# Patient Record
Sex: Male | Born: 1970 | Race: White | Hispanic: No | Marital: Single | Smoking: Former smoker
Health system: Southern US, Community
[De-identification: ages and names within clinical notes are randomized; demographics above are authoritative.]

## PROBLEM LIST (undated history)

## (undated) DIAGNOSIS — G629 Polyneuropathy, unspecified: Secondary | ICD-10-CM

## (undated) DIAGNOSIS — E119 Type 2 diabetes mellitus without complications: Secondary | ICD-10-CM

## (undated) DIAGNOSIS — I10 Essential (primary) hypertension: Secondary | ICD-10-CM

## (undated) HISTORY — DX: Type 2 diabetes mellitus without complications: E11.9

## (undated) HISTORY — PX: STOMACH FOREIGN BODY REMOVAL: SHX2449

---

## 2014-02-13 ENCOUNTER — Ambulatory Visit (INDEPENDENT_AMBULATORY_CARE_PROVIDER_SITE_OTHER): Payer: 59 | Admitting: Family Medicine

## 2014-02-13 VITALS — BP 126/78 | HR 91 | Temp 98.3°F | Resp 17 | Ht 70.5 in | Wt 234.0 lb

## 2014-02-13 DIAGNOSIS — R209 Unspecified disturbances of skin sensation: Secondary | ICD-10-CM

## 2014-02-13 DIAGNOSIS — E669 Obesity, unspecified: Secondary | ICD-10-CM

## 2014-02-13 DIAGNOSIS — Z125 Encounter for screening for malignant neoplasm of prostate: Secondary | ICD-10-CM

## 2014-02-13 DIAGNOSIS — R200 Anesthesia of skin: Secondary | ICD-10-CM

## 2014-02-13 DIAGNOSIS — R202 Paresthesia of skin: Secondary | ICD-10-CM

## 2014-02-13 LAB — LIPID PANEL
CHOLESTEROL: 477 mg/dL — AB (ref 0–200)
HDL: 26 mg/dL — ABNORMAL LOW (ref >39)
Total CHOL/HDL Ratio: 18.3 Ratio
Triglycerides: 3784 mg/dL — ABNORMAL HIGH (ref <150)

## 2014-02-13 LAB — COMPREHENSIVE METABOLIC PANEL
ALT: 33 U/L (ref 0–53)
AST: 62 U/L — ABNORMAL HIGH (ref 0–37)
Albumin: 4.9 g/dL (ref 3.5–5.2)
Alkaline Phosphatase: 79 U/L (ref 39–117)
BUN: 9 mg/dL (ref 6–23)
CO2: 27 mEq/L (ref 19–32)
Calcium: 9.6 mg/dL (ref 8.4–10.5)
Chloride: 91 mEq/L — ABNORMAL LOW (ref 96–112)
Creat: 1.44 mg/dL — ABNORMAL HIGH (ref 0.50–1.35)
Glucose, Bld: 249 mg/dL — ABNORMAL HIGH (ref 70–99)
Potassium: 4.4 mEq/L (ref 3.5–5.3)
Sodium: 127 mEq/L — ABNORMAL LOW (ref 135–145)
Total Bilirubin: 0.7 mg/dL (ref 0.2–1.2)
Total Protein: 7.8 g/dL (ref 6.0–8.3)

## 2014-02-13 NOTE — Patient Instructions (Signed)
Gradually decrease your Dr. Reino KentPepper consumption Think about stopping smoking- you can do it! See the tips below. Please make an appointment to establish regular care at the [facility] Center next door-  You will be notified of your lab results within 5-7 days. If you have not heard from us, please give us a call.  Smoking Cessation, Tips for Success If you are ready to quit smoking, congratulations! You have chosen to help yourself be healthier. Cigarettes bring nicotine, tar, carbon monoxide, and other irritants into your body. Your lungs, heart, and blood vessels will be able to work better without these poisons. There are many different ways to quit smoking. Nicotine gum, nicotine patches, a nicotine inhaler, or nicotine nasal spray can help with physical craving. Hypnosis, support groups, and medicines help break the habit of smoking. WHAT THINGS CAN I DO TO MAKE QUITTING EASIER?  Here are some tips to help you quit for good:  Pick a date when you will quit smoking completely. Tell all of your friends and family about your plan to quit on that date.  Do not try to slowly cut down on the number of cigarettes you are smoking. Pick a quit date and quit smoking completely starting on that day.  Throw away all cigarettes.   Clean and remove all ashtrays from your home, work, and car.   On a card, write down your reasons for quitting. Carry the card with you and read it when you get the urge to smoke.   Cleanse your body of nicotine. Drink enough water and fluids to keep your urine clear or pale yellow. Do this after quitting to flush the nicotine from your body.   Learn to predict your moods. Do not let a bad situation be your excuse to have a cigarette. Some situations in your life might tempt you into wanting a cigarette.   Never have "just one" cigarette. It leads to wanting another and another. Remind yourself of your decision to quit.   Change habits associated with smoking. If  you smoked while driving or when feeling stressed, try other activities to replace smoking. Stand up when drinking your coffee. Brush your teeth after eating. Sit in a different chair when you read the paper. Avoid alcohol while trying to quit, and try to drink fewer caffeinated beverages. Alcohol and caffeine may urge you to smoke.   Avoid foods and drinks that can trigger a desire to smoke, such as sugary or spicy foods and alcohol.   Ask people who smoke not to smoke around you.   Have something planned to do right after eating or having a cup of coffee. For example, plan to take a walk or exercise.   Try a relaxation exercise to calm you down and decrease your stress. Remember, you may be tense and nervous for the first 2 weeks after you quit, but this will pass.   Find new activities to keep your hands busy. Play with a pen, coin, or rubber band. Doodle or draw things on paper.   Brush your teeth right after eating. This will help cut down on the craving for the taste of tobacco after meals. You can also try mouthwash.   Use oral substitutes in place of cigarettes. Try using lemon drops, carrots, cinnamon sticks, or chewing gum. Keep them handy so they are available when you have the urge to smoke.   When you have the urge to smoke, try deep breathing.   Designate your home as a  nonsmoking area.   If you are a heavy smoker, ask your health care provider about a prescription for nicotine chewing gum. It can ease your withdrawal from nicotine.   Reward yourself. Set aside the cigarette money you save and buy yourself something nice.   Look for support from others. Join a support group or smoking cessation program. Ask someone at home or at work to help you with your plan to quit smoking.   Always ask yourself, "Do I need this cigarette or is this just a reflex?" Tell yourself, "Today, I choose not to smoke," or "I do not want to smoke." You are reminding yourself of your  decision to quit.  Do not replace cigarette smoking with electronic cigarettes (commonly called e-cigarettes). The safety of e-cigarettes is unknown, and some may contain harmful chemicals.  If you relapse, do not give up! Plan ahead and think about what you will do the next time you get the urge to smoke.  HOW WILL I FEEL WHEN I QUIT SMOKING? You may have symptoms of withdrawal because your body is used to nicotine (the addictive substance in cigarettes). You may crave cigarettes, be irritable, feel very hungry, cough often, get headaches, or have difficulty concentrating. The withdrawal symptoms are only temporary. They are strongest when you first quit but will go away within 10 14 days. When withdrawal symptoms occur, stay in control. Think about your reasons for quitting. Remind yourself that these are signs that your body is healing and getting used to being without cigarettes. Remember that withdrawal symptoms are easier to treat than the major diseases that smoking can cause.  Even after the withdrawal is over, expect periodic urges to smoke. However, these cravings are generally short lived and will go away whether you smoke or not. Do not smoke!  WHAT RESOURCES ARE AVAILABLE TO HELP ME QUIT SMOKING? Your health care provider can direct you to community resources or hospitals for support, which may include:  Group support.  Education.  Hypnosis.  Therapy. Document Released: 07/15/2004 Document Revised: 08/07/2013 Document Reviewed: 04/04/2013 [facility] Patient Information 2014 [place], [place].

## 2014-02-13 NOTE — Progress Notes (Signed)
   Subjective:    Patient ID: Calvin Wells, male    DOB: 26-Mar-1971, 43 y.o.   MRN: [id number]  HPI Woke up this morning and noticed that his right 4 th and 5 th fingers were numb. The 4 th finger is no longer numb, but the 5 th finger remains numb in the tip. He reports that this used to happen more often when he would sleep on his hand, but he avoided sleeping on his hand for the last 6 months with resolution of symptoms until today. Denies swelling or redness. No weakness.  Left hand injury, bumped it on a recliner, about one month ago. Feels like it is numb over dorsal area. Has been feeling better with time. No swelling or redness or difficulty with movement.  Also turned left ankle about a month ago and felt like he had a tear in it. It hurts intermittently and feels numb. This has gotten progressively better and does not effect his ambulation or ability to stand and work.  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   Patient smokes 1 ppd. Drinks 10-12 regular Dr. Reino KentPepper sodas a day. Reports he is really "thirsty for them." He is concerned that he may have diabetes; his older sister was recently diagnosed.   Review of Systems No chest pain, no shortness of breath, no cough, no nasal congestion.     Objective:   Physical Exam  Vitals reviewed. Constitutional: He is oriented to person, place, and time. He appears well-developed and well-nourished.  HENT:  Head: Normocephalic and atraumatic.  Right Ear: External ear normal.  Left Ear: External ear normal.  Mouth/Throat: Uvula is midline, oropharynx is clear and moist and mucous membranes are normal. Abnormal dentition.  Poor dentition.   Eyes: Conjunctivae are normal. Right eye exhibits no discharge. Left eye exhibits no discharge.  Neck: Normal range of motion. Neck supple.  Cardiovascular: Normal rate, regular rhythm, normal heart sounds and intact distal pulses.     Pulmonary/Chest: Effort normal and breath sounds normal.  Musculoskeletal: Normal range of motion. He exhibits no edema and no tenderness.  Bilateral hands with full ROM, good strength, brisk cap refill, strong radial pulses. No joint swelling or erythema.   Lymphadenopathy:    He has no cervical adenopathy.  Neurological: He is alert and oriented to person, place, and time. Coordination abnormal.  Skin: Skin is warm and dry.  Psychiatric: He has a normal mood and affect. His behavior is normal. Judgment and thought content normal.      Assessment & Plan:  1. Obesity, unspecified - Comprehensive metabolic panel - Lipid panel -Discussed concern about diabetes and encouraged patient to drastically and immediately reduce regular soda intake.  2. Numbness and tingling in right hand - CBC  3. Encounter for screening for malignant neoplasm of prostate - PSA  4- Right hand numbness -This appears to be intermittent and self limiting. Patient has been able to avoid this for 6 months by avoiding sleeping on his hand. Encouraged him to continue to avoid compressing hand/fingers during sleep.  5- Tobacco abuse -Provided written and verbal encouragement and stop smoking strategies. -[facility]our[facility] patient [facility]  Emi Belfasteborah B. Gessner, FNP-BC  [facility]  02/14/2014 1:13 PM

## 2014-02-14 ENCOUNTER — Other Ambulatory Visit: Payer: Self-pay | Admitting: Family Medicine

## 2014-02-14 DIAGNOSIS — E871 Hypo-osmolality and hyponatremia: Secondary | ICD-10-CM

## 2014-02-14 LAB — CBC
HCT: 47.9 % (ref 39.0–52.0)
HEMOGLOBIN: 16.5 g/dL (ref 13.0–17.0)
MCH: 32.4 pg (ref 26.0–34.0)
MCHC: 34.4 g/dL (ref 30.0–36.0)
MCV: 94.1 fL (ref 78.0–100.0)
PLATELETS: 155 10*3/uL (ref 150–400)
RBC: 5.09 MIL/uL (ref 4.22–5.81)
RDW: 13.3 % (ref 11.5–15.5)
WBC: 10.6 10*3/uL — ABNORMAL HIGH (ref 4.0–10.5)

## 2014-02-14 LAB — PSA: PSA: 0.5 ng/mL (ref <=4.00)

## 2014-02-15 ENCOUNTER — Other Ambulatory Visit (INDEPENDENT_AMBULATORY_CARE_PROVIDER_SITE_OTHER): Payer: 59 | Admitting: Physician Assistant

## 2014-02-15 ENCOUNTER — Other Ambulatory Visit: Payer: Self-pay | Admitting: Physician Assistant

## 2014-02-15 VITALS — BP 130/86 | HR 82 | Temp 98.1°F | Resp 17 | Ht 71.0 in | Wt 229.5 lb

## 2014-02-15 DIAGNOSIS — E781 Pure hyperglyceridemia: Secondary | ICD-10-CM

## 2014-02-15 DIAGNOSIS — E871 Hypo-osmolality and hyponatremia: Secondary | ICD-10-CM

## 2014-02-15 DIAGNOSIS — R7309 Other abnormal glucose: Secondary | ICD-10-CM

## 2014-02-15 LAB — POCT GLYCOSYLATED HEMOGLOBIN (HGB A1C): Hemoglobin A1C: 7.4

## 2014-02-15 MED ORDER — METFORMIN HCL 500 MG PO TABS: 500.0000 mg | ORAL_TABLET | Freq: Every day | ORAL | Status: DC

## 2014-02-15 MED ORDER — GEMFIBROZIL 600 MG PO TABS: 600.0000 mg | ORAL_TABLET | Freq: Two times a day (BID) | ORAL | Status: DC

## 2014-02-15 NOTE — Progress Notes (Signed)
   Subjective:    Patient ID: Calvin Wells, male    DOB: 1971-01-08, 43 y.o.   MRN: [id number]  HPI   Calvin Wells is a very pleasant 43 yr old here for follow up on labs.  He was initially seen here 02/13/14 at which time it was noted that sodium was low (127) , glucose was high (249), and triglycerides were severely elevated at >3700.  At that time, pt was asked to fluid restrict and return for a stat BMP today.  Upon further discussion and research, it was felt that pt likely had pseudohyponatremia due to his severely elevated triglycerides (using correction factor Plasma triglycerides (g/L) x 0.002 = mEq/L decrease in Na +, pt's true sodium is thought to be 134)  He has no symptoms of hyponatremia today.  At this point, normalizing pt's triglycerides and glucose are the priority.      Review of Systems  Constitutional: Negative.   Respiratory: Negative.   Cardiovascular: Negative.        Objective:   Physical Exam  Vitals reviewed. Constitutional: He is oriented to person, place, and time. He appears well-developed and well-nourished. No distress.  HENT:  Head: Normocephalic and atraumatic.  Eyes: Conjunctivae are normal. No scleral icterus.  Pulmonary/Chest: Effort normal.  Neurological: He is alert and oriented to person, place, and time.  Skin: Skin is warm and dry.  Psychiatric: He has a normal mood and affect. His behavior is normal.      Results for orders placed in visit on 02/15/14  POCT GLYCOSYLATED HEMOGLOBIN (HGB A1C)      Result Value Ref Range   Hemoglobin A1C 7.4         Assessment & Plan:  Low sodium levels   Elevated glucose - Plan: POCT glycosylated hemoglobin (Hb A1C)  Hypertriglyceridemia  Calvin Wells is a very pleasant 43 yr old male here for follow up on labs from 02/13/14.  Pt has severely elevated triglycerides which is thought to have caused a falsely low sodium reading on CMP.  Based on correction factors, it is estimated that his true sodium is 134.  He  has no signs or symptoms of hyponatremia and has been in his usual state of health.  At this point, correcting his triglycerides are the priority.  We discussed that he is at increased risk of developing pancreatitis.  Will start Lopid BID.  We discussed the importance of lifestyle modifications as well - increase vegetables, fruits, lean meats, whole grains; avoid fast food and processed food; avoid sugar sweetened beverages; limit or preferably avoid etoh.  Encouraged daily exercise as well.    Will start low dose metformin today.  Random glucose at last visit 249 and A1C is 7.4 today.    Discussed with pt how to take medications (Lopid prior to meals, metformin with food)  He is to recheck (either by appt or walk in) within the next 2-4 wks.  He voices understanding and agrees with this plan.  Pt to RTC sooner if concerns prior to that.    Loleta DickerE. Elizabeth Abisai Deer MHS, PA-C [facility] 4/18/201511:23 AM

## 2014-02-15 NOTE — Patient Instructions (Signed)
Begin taking the gemfibrozil twice daily (about 30 minutes before breakfast and 30 minutes before dinner)  Begin taking the metformin once daily (take WITH breakfast)  In addition to taking these medicines, make sure you are eating plenty of vegetables, fruits, lean meats and whole grains.  Limit (or completely avoid!) fast food and processed food.  Limit alcohol to no more than 1 drink per day (or preferable abstain from alcohol completely.)  Make sure you are getting some exercise every day even if it just a brisk walk - all of these will help improve your triglycerides in addition to the gemfibrozil  Please come back in about 2-4 weeks to recheck your labs    Hypertriglyceridemia  Diet for High blood levels of Triglycerides Most fats in food are triglycerides. Triglycerides in your blood are stored as fat in your body. High levels of triglycerides in your blood may put you at a greater risk for heart disease and stroke.  Normal triglyceride levels are less than 150 mg/dL. Borderline high levels are 150-199 mg/dl. High levels are 200 - 499 mg/dL, and very high triglyceride levels are greater than 500 mg/dL. The decision to treat high triglycerides is generally based on the level. For people with borderline or high triglyceride levels, treatment includes weight loss and exercise. Drugs are recommended for people with very high triglyceride levels. Many people who need treatment for high triglyceride levels have metabolic syndrome. This syndrome is a collection of disorders that often include: insulin resistance, high blood pressure, blood clotting problems, high cholesterol and triglycerides. TESTING PROCEDURE FOR TRIGLYCERIDES  You should not eat 4 hours before getting your triglycerides measured. The normal range of triglycerides is between 10 and 250 milligrams per deciliter (mg/dl). Some people may have extreme levels (1000 or above), but your triglyceride level may be too high if it is above  150 mg/dl, depending on what other risk factors you have for heart disease.  People with high blood triglycerides may also have high blood cholesterol levels. If you have high blood cholesterol as well as high blood triglycerides, your risk for heart disease is probably greater than if you only had high triglycerides. High blood cholesterol is one of the main risk factors for heart disease. CHANGING YOUR DIET  Your weight can affect your blood triglyceride level. If you are more than 20% above your ideal body weight, you may be able to lower your blood triglycerides by losing weight. Eating less and exercising regularly is the best way to combat this. Fat provides more calories than any other food. The best way to lose weight is to eat less fat. Only 30% of your total calories should come from fat. Less than 7% of your diet should come from saturated fat. A diet low in fat and saturated fat is the same as a diet to decrease blood cholesterol. By eating a diet lower in fat, you may lose weight, lower your blood cholesterol, and lower your blood triglyceride level.  Eating a diet low in fat, especially saturated fat, may also help you lower your blood triglyceride level. Ask your dietitian to help you figure how much fat you can eat based on the number of calories your caregiver has prescribed for you.  Exercise, in addition to helping with weight loss may also help lower triglyceride levels.   Alcohol can increase blood triglycerides. You may need to stop drinking alcoholic beverages.  Too much carbohydrate in your diet may also increase your blood triglycerides. Some complex  carbohydrates are necessary in your diet. These may include bread, rice, potatoes, other starchy vegetables and cereals.  Reduce "simple" carbohydrates. These may include pure sugars, candy, honey, and jelly without losing other nutrients. If you have the kind of high blood triglycerides that is affected by the amount of  carbohydrates in your diet, you will need to eat less sugar and less high-sugar foods. Your caregiver can help you with this.  Adding 2-4 grams of fish oil (EPA+ DHA) may also help lower triglycerides. Speak with your caregiver before adding any supplements to your regimen. Following the Diet  Maintain your ideal weight. Your caregivers can help you with a diet. Generally, eating less food and getting more exercise will help you lose weight. Joining a weight control group may also help. Ask your caregivers for a good weight control group in your area.  Eat low-fat foods instead of high-fat foods. This can help you lose weight too.  These foods are lower in fat. Eat MORE of these:   Dried beans, peas, and lentils.  Egg whites.  Low-fat cottage cheese.  Fish.  Lean cuts of meat, such as round, sirloin, rump, and flank (cut extra fat off meat you fix).  Whole grain breads, cereals and pasta.  Skim and nonfat dry milk.  Low-fat yogurt.  Poultry without the skin.  Cheese made with skim or part-skim milk, such as mozzarella, parmesan, farmers', ricotta, or pot cheese. These are higher fat foods. Eat LESS of these:   Whole milk and foods made from whole milk, such as American, blue, cheddar, monterey jack, and swiss cheese  High-fat meats, such as luncheon meats, sausages, knockwurst, bratwurst, hot dogs, ribs, corned beef, ground pork, and regular ground beef.  Fried foods. Limit saturated fats in your diet. Substituting unsaturated fat for saturated fat may decrease your blood triglyceride level. You will need to read package labels to know which products contain saturated fats.  These foods are high in saturated fat. Eat LESS of these:   Fried pork skins.  Whole milk.  Skin and fat from poultry.  Palm oil.  Butter.  Shortening.  Cream cheese.  Tomasa BlaseBacon.  Margarines and baked goods made from listed oils.  Vegetable shortenings.  Chitterlings.  Fat from  meats.  Coconut oil.  Palm kernel oil.  Lard.  Cream.  Sour cream.  Fatback.  Coffee whiteners and non-dairy creamers made with these oils.  Cheese made from whole milk. Use unsaturated fats (both polyunsaturated and monounsaturated) moderately. Remember, even though unsaturated fats are better than saturated fats; you still want a diet low in total fat.  These foods are high in unsaturated fat:   Canola oil.  Sunflower oil.  Mayonnaise.  Almonds.  Peanuts.  Pine nuts.  Margarines made with these oils.  Safflower oil.  Olive oil.  Avocados.  Cashews.  Peanut butter.  Sunflower seeds.  Soybean oil.  Peanut oil.  Olives.  Pecans.  Walnuts.  Pumpkin seeds. Avoid sugar and other high-sugar foods. This will decrease carbohydrates without decreasing other nutrients. Sugar in your food goes rapidly to your blood. When there is excess sugar in your blood, your liver may use it to make more triglycerides. Sugar also contains calories without other important nutrients.  Eat LESS of these:   Sugar, brown sugar, powdered sugar, jam, jelly, preserves, honey, syrup, molasses, pies, candy, cakes, cookies, frosting, pastries, colas, soft drinks, punches, fruit drinks, and regular gelatin.  Avoid alcohol. Alcohol, even more than sugar, may increase blood  triglycerides. In addition, alcohol is high in calories and low in nutrients. Ask for sparkling water, or a diet soft drink instead of an alcoholic beverage. Suggestions for planning and preparing meals   Bake, broil, grill or roast meats instead of frying.  Remove fat from meats and skin from poultry before cooking.  Add spices, herbs, lemon juice or vinegar to vegetables instead of salt, rich sauces or gravies.  Use a non-stick skillet without fat or use no-stick sprays.  Cool and refrigerate stews and broth. Then remove the hardened fat floating on the surface before serving.  Refrigerate meat drippings  and skim off fat to make low-fat gravies.  Serve more fish.  Use less butter, margarine and other high-fat spreads on bread or vegetables.  Use skim or reconstituted non-fat dry milk for cooking.  Cook with low-fat cheeses.  Substitute low-fat yogurt or cottage cheese for all or part of the sour cream in recipes for sauces, dips or congealed salads.  Use half yogurt/half mayonnaise in salad recipes.  Substitute evaporated skim milk for cream. Evaporated skim milk or reconstituted non-fat dry milk can be whipped and substituted for whipped cream in certain recipes.  Choose fresh fruits for dessert instead of high-fat foods such as pies or cakes. Fruits are naturally low in fat. When Dining Out   Order low-fat appetizers such as fruit or vegetable juice, pasta with vegetables or tomato sauce.  Select clear, rather than cream soups.  Ask that dressings and gravies be served on the side. Then use less of them.  Order foods that are baked, broiled, poached, steamed, stir-fried, or roasted.  Ask for margarine instead of butter, and use only a small amount.  Drink sparkling water, unsweetened tea or coffee, or diet soft drinks instead of alcohol or other sweet beverages. QUESTIONS AND ANSWERS ABOUT OTHER FATS IN THE BLOOD: SATURATED FAT, TRANS FAT, AND CHOLESTEROL What is trans fat? Trans fat is a type of fat that is formed when vegetable oil is hardened through a process called hydrogenation. This process helps makes foods more solid, gives them shape, and prolongs their shelf life. Trans fats are also called hydrogenated or partially hydrogenated oils.  What do saturated fat, trans fat, and cholesterol in foods have to do with heart disease? Saturated fat, trans fat, and cholesterol in the diet all raise the level of LDL "bad" cholesterol in the blood. The higher the LDL cholesterol, the greater the risk for coronary heart disease (CHD). Saturated fat and trans fat raise LDL similarly.   What foods contain saturated fat, trans fat, and cholesterol? High amounts of saturated fat are found in animal products, such as fatty cuts of meat, chicken skin, and full-fat dairy products like butter, whole milk, cream, and cheese, and in tropical vegetable oils such as palm, palm kernel, and coconut oil. Trans fat is found in some of the same foods as saturated fat, such as vegetable shortening, some margarines (especially hard or stick margarine), crackers, cookies, baked goods, fried foods, salad dressings, and other processed foods made with partially hydrogenated vegetable oils. Small amounts of trans fat also occur naturally in some animal products, such as milk products, beef, and lamb. Foods high in cholesterol include liver, other organ meats, egg yolks, shrimp, and full-fat dairy products. How can I use the new food label to make heart-healthy food choices? Check the Nutrition Facts panel of the food label. Choose foods lower in saturated fat, trans fat, and cholesterol. For saturated fat and cholesterol,  you can also use the Percent Daily Value (%DV): 5% DV or less is low, and 20% DV or more is high. (There is no %DV for trans fat.) Use the Nutrition Facts panel to choose foods low in saturated fat and cholesterol, and if the trans fat is not listed, read the ingredients and limit products that list shortening or hydrogenated or partially hydrogenated vegetable oil, which tend to be high in trans fat. POINTS TO REMEMBER:   Discuss your risk for heart disease with your caregivers, and take steps to reduce risk factors.  Change your diet. Choose foods that are low in saturated fat, trans fat, and cholesterol.  Add exercise to your daily routine if it is not already being done. Participate in physical activity of moderate intensity, like brisk walking, for at least 30 minutes on most, and preferably all days of the week. No time? Break the 30 minutes into three, 10-minute segments during the  day.  Stop smoking. If you do smoke, contact your caregiver to discuss ways in which they can help you quit.  Do not use street drugs.  Maintain a normal weight.  Maintain a healthy blood pressure.  Keep up with your blood work for checking the fats in your blood as directed by your caregiver. Document Released: 08/04/2004 Document Revised: 04/17/2012 Document Reviewed: 03/02/2009 [facility] Patient Information 2014 [place], [place].

## 2014-02-15 NOTE — Progress Notes (Signed)
See lab only encounter dated 02/15/14 - I was unable to order medications in that encounter and thus did an orders only to send meds

## 2014-02-17 NOTE — Progress Notes (Signed)
I have discussed this case with Ms. Gessner, NP and agree.  

## 2014-03-08 ENCOUNTER — Other Ambulatory Visit (INDEPENDENT_AMBULATORY_CARE_PROVIDER_SITE_OTHER): Payer: 59 | Admitting: *Deleted

## 2014-03-08 ENCOUNTER — Telehealth: Payer: Self-pay

## 2014-03-08 ENCOUNTER — Ambulatory Visit (INDEPENDENT_AMBULATORY_CARE_PROVIDER_SITE_OTHER): Payer: 59

## 2014-03-08 DIAGNOSIS — E1165 Type 2 diabetes mellitus with hyperglycemia: Secondary | ICD-10-CM

## 2014-03-08 DIAGNOSIS — E785 Hyperlipidemia, unspecified: Secondary | ICD-10-CM

## 2014-03-08 DIAGNOSIS — E871 Hypo-osmolality and hyponatremia: Secondary | ICD-10-CM

## 2014-03-08 DIAGNOSIS — R7309 Other abnormal glucose: Secondary | ICD-10-CM

## 2014-03-08 DIAGNOSIS — IMO0001 Reserved for inherently not codable concepts without codable children: Secondary | ICD-10-CM

## 2014-03-08 LAB — BASIC METABOLIC PANEL
BUN: 8 mg/dL (ref 6–23)
CO2: 27 mEq/L (ref 19–32)
Calcium: 9.7 mg/dL (ref 8.4–10.5)
Chloride: 102 mEq/L (ref 96–112)
Creat: 0.94 mg/dL (ref 0.50–1.35)
GLUCOSE: 138 mg/dL — AB (ref 70–99)
POTASSIUM: 4.5 meq/L (ref 3.5–5.3)
Sodium: 138 mEq/L (ref 135–145)

## 2014-03-25 ENCOUNTER — Ambulatory Visit (INDEPENDENT_AMBULATORY_CARE_PROVIDER_SITE_OTHER): Payer: 59 | Admitting: Family Medicine

## 2014-03-25 ENCOUNTER — Encounter: Payer: Self-pay | Admitting: Family Medicine

## 2014-03-25 VITALS — BP 130/80 | HR 72 | Resp 18 | Ht 70.5 in | Wt 221.0 lb

## 2014-03-25 DIAGNOSIS — E119 Type 2 diabetes mellitus without complications: Secondary | ICD-10-CM

## 2014-03-25 DIAGNOSIS — E785 Hyperlipidemia, unspecified: Secondary | ICD-10-CM | POA: Insufficient documentation

## 2014-03-25 LAB — COMPREHENSIVE METABOLIC PANEL
ALT: 72 U/L — AB (ref 0–53)
AST: 52 U/L — ABNORMAL HIGH (ref 0–37)
Albumin: 5 g/dL (ref 3.5–5.2)
Alkaline Phosphatase: 65 U/L (ref 39–117)
BUN: 8 mg/dL (ref 6–23)
CALCIUM: 10.1 mg/dL (ref 8.4–10.5)
CHLORIDE: 99 meq/L (ref 96–112)
CO2: 29 meq/L (ref 19–32)
CREATININE: 0.98 mg/dL (ref 0.50–1.35)
GLUCOSE: 136 mg/dL — AB (ref 70–99)
Potassium: 4.4 mEq/L (ref 3.5–5.3)
Sodium: 137 mEq/L (ref 135–145)
Total Bilirubin: 0.8 mg/dL (ref 0.2–1.2)
Total Protein: 7.7 g/dL (ref 6.0–8.3)

## 2014-03-25 LAB — LIPID PANEL
Cholesterol: 212 mg/dL — ABNORMAL HIGH (ref 0–200)
HDL: 26 mg/dL — ABNORMAL LOW (ref >39)
TRIGLYCERIDES: 474 mg/dL — AB (ref <150)
Total CHOL/HDL Ratio: 8.2 Ratio

## 2014-03-25 NOTE — Patient Instructions (Signed)
Keep up the good work with your diet. I will notify you of your lab results. Please resume your medications. Follow up in 3 months, sooner if you have problems or questions.  Diabetes and Exercise Exercising regularly is important. It is not just about losing weight. It has many health benefits, such as:  Improving your overall fitness, flexibility, and endurance.  Increasing your bone density.  Helping with weight control.  Decreasing your body fat.  Increasing your muscle strength.  Reducing stress and tension.  Improving your overall health. People with diabetes who exercise gain additional benefits because exercise:  Reduces appetite.  Improves the body's use of blood sugar (glucose).  Helps lower or control blood glucose.  Decreases blood pressure.  Helps control blood lipids (such as cholesterol and triglycerides).  Improves the body's use of the hormone insulin by:  Increasing the body's insulin sensitivity.  Reducing the body's insulin needs.  Decreases the risk for heart disease because exercising:  Lowers cholesterol and triglycerides levels.  Increases the levels of good cholesterol (such as high-density lipoproteins [HDL]) in the body.  Lowers blood glucose levels. YOUR ACTIVITY PLAN  Choose an activity that you enjoy and set realistic goals. Your health care provider or diabetes educator can help you make an activity plan that works for you. You can break activities into 2 or 3 sessions throughout the day. Doing so is as good as one long session. Exercise ideas include:  Taking the dog for a walk.  Taking the stairs instead of the elevator.  Dancing to your favorite song.  Doing your favorite exercise with a friend. RECOMMENDATIONS FOR EXERCISING WITH TYPE 1 OR TYPE 2 DIABETES   Check your blood glucose before exercising. If blood glucose levels are greater than 240 mg/dL, check for urine ketones. Do not exercise if ketones are present.  Avoid  injecting insulin into areas of the body that are going to be exercised. For example, avoid injecting insulin into:  The arms when playing tennis.  The legs when jogging.  Keep a record of:  Food intake before and after you exercise.  Expected peak times of insulin action.  Blood glucose levels before and after you exercise.  The type and amount of exercise you have done.  Review your records with your health care provider. Your health care provider will help you to develop guidelines for adjusting food intake and insulin amounts before and after exercising.  If you take insulin or oral hypoglycemic agents, watch for signs and symptoms of hypoglycemia. They include:  Dizziness.  Shaking.  Sweating.  Chills.  Confusion.  Drink plenty of water while you exercise to prevent dehydration or heat stroke. Body water is lost during exercise and must be replaced.  Talk to your health care provider before starting an exercise program to make sure it is safe for you. Remember, almost any type of activity is better than none. Document Released: 01/07/2004 Document Revised: 06/19/2013 Document Reviewed: 03/26/2013 [facility] Patient Information 2014 [place], [place].

## 2014-03-25 NOTE — Progress Notes (Signed)
   Subjective:    Patient ID: Calvin Wells, male    DOB: 1971-03-22, 43 y.o.   MRN: [id number]  HPI Patient presents today for follow up of DM and hypertriglyceridemia. Has been watching diet and has lost 13 pounds since 02/13/14. Has significantly decreased regular soda intake and is eating more lean proteins, more fruits and vegetables. Is avoiding processed food and fast foods. He reports that his energy level is greatly improved and he "feels great."  Patient was prescribed gemfibrozil and metformin about 5 weeks ago. He took both prescriptions for 1 month and did not realize he was supposed to refill them and continue taking until follow up visit. He denies side effects while taking them.  He has not had any further hand pain/tingling.  Patient aware of need for smoking cessation, wanted to work on diet and weight loss and will work on smoking cessation next.   Review of Systems No visual changes, no nausea/vomiting, no chest pain, no SOB    Objective:   Physical Exam  Vitals reviewed. Constitutional: He is oriented to person, place, and time. He appears well-developed and well-nourished. No distress.  HENT:  Head: Normocephalic and atraumatic.  Right Ear: Tympanic membrane, external ear and ear canal normal.  Left Ear: Tympanic membrane, external ear and ear canal normal.  Nose: Nose normal.  Mouth/Throat: Uvula is midline, oropharynx is clear and moist and mucous membranes are normal.  Eyes: Conjunctivae are normal. Pupils are equal, round, and reactive to light. Right eye exhibits no discharge. Left eye exhibits no discharge. No scleral icterus.  Neck: Normal range of motion. Neck supple.  Cardiovascular: Normal rate, regular rhythm, normal heart sounds and intact distal pulses.   Pulmonary/Chest: Effort normal and breath sounds normal.  Musculoskeletal: Normal range of motion.  Lymphadenopathy:    He has no cervical adenopathy.  Neurological: He is alert and oriented to person,  place, and time.  Skin: Skin is warm and dry. He is not diaphoretic.  Psychiatric: He has a normal mood and affect. His behavior is normal. Judgment and thought content normal.       Assessment & Plan:  1. Other and unspecified hyperlipidemia - Comprehensive metabolic panel - Lipid panel -Continue decreasing sugar consumption and avoiding simple carbohydrates.  2. Diabetes -Discussed diagnosis and continued weight loss, increased exercise and continued metformin. - Comprehensive metabolic panel -Patient will schedule eye exam - Will follow up in 3 months with HgA1C   Emi Belfast, FNP-BC  [facility], [facility]  03/25/2014 11:02 AM   Emi Belfast, FNP-BC  [facility], [facility]  03/25/2014 11:01 AM

## 2014-03-26 DIAGNOSIS — E119 Type 2 diabetes mellitus without complications: Secondary | ICD-10-CM | POA: Insufficient documentation

## 2014-04-08 ENCOUNTER — Telehealth: Payer: Self-pay

## 2014-05-13 ENCOUNTER — Encounter: Payer: Self-pay | Admitting: Family Medicine

## 2014-05-13 ENCOUNTER — Ambulatory Visit (INDEPENDENT_AMBULATORY_CARE_PROVIDER_SITE_OTHER): Payer: 59 | Admitting: Family Medicine

## 2014-05-13 VITALS — BP 113/70 | HR 70 | Temp 98.6°F | Resp 16 | Ht 71.0 in | Wt 217.0 lb

## 2014-05-13 DIAGNOSIS — E781 Pure hyperglyceridemia: Secondary | ICD-10-CM

## 2014-05-13 DIAGNOSIS — R739 Hyperglycemia, unspecified: Secondary | ICD-10-CM

## 2014-05-13 DIAGNOSIS — E785 Hyperlipidemia, unspecified: Secondary | ICD-10-CM

## 2014-05-13 DIAGNOSIS — R7309 Other abnormal glucose: Secondary | ICD-10-CM

## 2014-05-13 DIAGNOSIS — F17200 Nicotine dependence, unspecified, uncomplicated: Secondary | ICD-10-CM

## 2014-05-13 DIAGNOSIS — Z720 Tobacco use: Secondary | ICD-10-CM

## 2014-05-13 DIAGNOSIS — E119 Type 2 diabetes mellitus without complications: Secondary | ICD-10-CM

## 2014-05-13 LAB — HEMOGLOBIN A1C
Hgb A1c MFr Bld: 5.9 % — ABNORMAL HIGH (ref <5.7)
MEAN PLASMA GLUCOSE: 123 mg/dL — AB (ref <117)

## 2014-05-13 MED ORDER — GEMFIBROZIL 600 MG PO TABS: 600.0000 mg | ORAL_TABLET | Freq: Two times a day (BID) | ORAL | Status: DC

## 2014-05-13 MED ORDER — METFORMIN HCL 500 MG PO TABS: 500.0000 mg | ORAL_TABLET | Freq: Every day | ORAL | Status: DC

## 2014-05-13 NOTE — Patient Instructions (Signed)
Keep up the good work with good food choices! Aim to lose about 1 pound a week- keep decreasing soda intake and increasing water intake

## 2014-05-13 NOTE — Progress Notes (Signed)
   Subjective:    Patient ID: Calvin Wells, male    DOB: Mar 25, 1971, 43 y.o.   MRN: [id number]  HPI Patient presents today follow up of DM/hypertrygleceridemia. Has had more energy with weight loss and watching what he eats. He feels like he is doing well physically, but is struggling emotionally. His mother passed away recently. She was 80 and had diabetes. He is sleeping well. He is able to enjoy activities and has been playing a lot of golf. His sister is helping him read food labels and make better food choices. He is drinking 1-2 cans Dr. Reino KentPepper a day, down from 6+. He is eating more lean meat/fish and snacking on fruit.   He is smoking less, 1 ppd, down from 1.5 ppd. He is contemplating quitting.  Last tetanus shot 5-6 years ago with work injury requiring stitches. Has regular dental care and eye exams.  Review of Systems No chest pain, no SOB,no wheeze, no headache, no medication side effects.     Objective:   Physical Exam  Vitals reviewed. Constitutional: He is oriented to person, place, and time. He appears well-developed and well-nourished.  HENT:  Head: Normocephalic and atraumatic.  Eyes: Conjunctivae are normal. Right eye exhibits no discharge. Left eye exhibits no discharge.  Neck: Normal range of motion. Neck supple.  Cardiovascular: Normal rate, regular rhythm and normal heart sounds.   Pulmonary/Chest: Effort normal and breath sounds normal.  Musculoskeletal: Normal range of motion.  Neurological: He is alert and oriented to person, place, and time.  Skin: Skin is warm and dry.  Psychiatric: He has a normal mood and affect. His behavior is normal. Judgment and thought content normal.   Wt Readings from Last 3 Encounters:  05/13/14 217 lb (98.431 kg)  03/25/14 221 lb (100.245 kg)  02/15/14 229 lb 8 oz (104.101 kg)  02/13/14 234 lb    Assessment & Plan:  1. Type 2 diabetes mellitus without complication - metFORMIN (GLUCOPHAGE) 500 MG tablet; Take 1 tablet (500 mg  total) by mouth daily with breakfast.  Dispense: 90 tablet; Refill: 1 - Hemoglobin A1c - encouraged continued weight loss, with goal 1 pound per week. 2. Other and unspecified hyperlipidemia  3. Elevated random blood glucose level - metFORMIN (GLUCOPHAGE) 500 MG tablet; Take 1 tablet (500 mg total) by mouth daily with breakfast.  Dispense: 90 tablet; Refill: 1 - Hemoglobin A1c  4. Hypertriglyceridemia - gemfibrozil (LOPID) 600 MG tablet; Take 1 tablet (600 mg total) by mouth 2 (two) times daily before a meal.  Dispense: 180 tablet; Refill: 1  5. Tobacco abuse - discussed smoking cessation, encouraged patient to continue contemplation and pick stop date  Follow up in 3-4 months   Emi Belfasteborah B. Taivon Haroon, FNP-BC  [facility], [facility]  05/13/2014 10:46 AM

## 2014-05-19 ENCOUNTER — Other Ambulatory Visit: Payer: Self-pay | Admitting: Physician Assistant

## 2014-06-03 ENCOUNTER — Ambulatory Visit: Payer: 59 | Admitting: Family Medicine

## 2014-08-12 ENCOUNTER — Ambulatory Visit: Payer: 59 | Admitting: Family Medicine

## 2014-08-18 ENCOUNTER — Ambulatory Visit: Payer: 59 | Admitting: Family Medicine

## 2014-11-15 ENCOUNTER — Other Ambulatory Visit: Payer: Self-pay | Admitting: Family Medicine

## 2014-12-13 ENCOUNTER — Other Ambulatory Visit: Payer: Self-pay | Admitting: Family Medicine

## 2014-12-31 ENCOUNTER — Other Ambulatory Visit: Payer: Self-pay | Admitting: Physician Assistant

## 2015-01-01 NOTE — Telephone Encounter (Signed)
Called pt to find out f/up plan bc we have put notices on last 2 RFs that he needs f/up. Pt advised that he did see them messages, but he has lost his job and insurance. He is hoping for a call back on a new job next week but as of now, it is difficult for him to come in. Eunice BlaseDebbie, you had asked pt to f/up in 3 mos when seen in July. Do you want to give him any more RFs, or do you need to see pt back before more RFs? If he needs OV now, do you have estimate of LOS and or any labs or other fees so that we can get pt an idea of expense?

## 2015-01-02 NOTE — Telephone Encounter (Signed)
-----   Message from Emi Belfasteborah B Gessner, FNP sent at 01/02/2015  8:23 AM EST ----- Leodis LiverpoolHey Mckinnley Cottier, I have approved this patient's 15 day supply of meds. I think it would be best for him to be seen. Approximate charges - 99213 OV, fasting lipid panel, HgBA1C and CMET. He can likely negotiate a discount with [facility] for his lab charges.  Thanks, Ameren CorporationDebbie

## 2015-01-02 NOTE — Telephone Encounter (Signed)
I spoke to pt and gave him the approx cost after talking with Billing for charges. Also advised there would be an add'l lab charge from [facility] but that they may be willing to negotiate a discount also. Transferred pt to Scheduling for appt. Newt Minionebbie, FYI

## 2015-01-26 ENCOUNTER — Encounter: Payer: Self-pay | Admitting: Family Medicine

## 2015-01-26 ENCOUNTER — Ambulatory Visit (INDEPENDENT_AMBULATORY_CARE_PROVIDER_SITE_OTHER): Payer: Self-pay | Admitting: Family Medicine

## 2015-01-26 VITALS — BP 127/84 | HR 108 | Temp 98.4°F | Resp 16 | Ht 72.0 in | Wt 227.0 lb

## 2015-01-26 DIAGNOSIS — E781 Pure hyperglyceridemia: Secondary | ICD-10-CM

## 2015-01-26 DIAGNOSIS — R7989 Other specified abnormal findings of blood chemistry: Secondary | ICD-10-CM

## 2015-01-26 DIAGNOSIS — E1165 Type 2 diabetes mellitus with hyperglycemia: Secondary | ICD-10-CM

## 2015-01-26 DIAGNOSIS — R945 Abnormal results of liver function studies: Principal | ICD-10-CM

## 2015-01-26 LAB — COMPREHENSIVE METABOLIC PANEL
ALK PHOS: 67 U/L (ref 39–117)
ALT: 26 U/L (ref 0–53)
AST: 24 U/L (ref 0–37)
Albumin: 4.9 g/dL (ref 3.5–5.2)
BILIRUBIN TOTAL: 0.6 mg/dL (ref 0.2–1.2)
BUN: 13 mg/dL (ref 6–23)
CO2: 24 mEq/L (ref 19–32)
Calcium: 10.2 mg/dL (ref 8.4–10.5)
Chloride: 98 mEq/L (ref 96–112)
Creat: 0.9 mg/dL (ref 0.50–1.35)
Glucose, Bld: 211 mg/dL — ABNORMAL HIGH (ref 70–99)
Potassium: 4.2 mEq/L (ref 3.5–5.3)
SODIUM: 133 meq/L — AB (ref 135–145)
TOTAL PROTEIN: 8.1 g/dL (ref 6.0–8.3)

## 2015-01-26 LAB — LIPID PANEL
CHOLESTEROL: 339 mg/dL — AB (ref 0–200)
HDL: 29 mg/dL — ABNORMAL LOW (ref >=40)
Total CHOL/HDL Ratio: 11.7 Ratio
Triglycerides: 1303 mg/dL — ABNORMAL HIGH (ref <150)

## 2015-01-26 LAB — POCT GLYCOSYLATED HEMOGLOBIN (HGB A1C): Hemoglobin A1C: 6.5

## 2015-01-26 NOTE — Patient Instructions (Signed)
Blood sugar is borderline diabetic- keep working on healthy eating, decrease your soda intake and increase physical activity. Try to get some type of exercise every other day (gardening and housework do count) We will call you about your other blood tests and whether or not you need to adjust your medication.

## 2015-01-26 NOTE — Progress Notes (Signed)
   Subjective:    Patient ID: Calvin Wells, male    DOB: September 20, 1971, 44 y.o.   MRN: [id number]  HPI Patient presents today in follow up of DM 2 and hypertriglyceridemia. He lost his job of 15 years awhile back and no longer has insurance. He was off his metformin and gemfibrozil for a couple of weeks, but is back on them. He continues to watch his diet, but has gained back some of the weight he lost after his initial diagnosis.  He continues to smoke 1 ppd. He doesn't smoke as much when he is busy. He is using his time of unemployment to do some projects and yard work. He denies anxiety or depression and reports that he sleeps well most nights.   Review of Systems No nausea, no vomiting, no chest pain or SOB. Some loose stools.     Objective:   Physical Exam  Constitutional: He is oriented to person, place, and time. He appears well-developed and well-nourished.  HENT:  Head: Normocephalic and atraumatic.  Eyes: Conjunctivae are normal. Pupils are equal, round, and reactive to light.  Neck: Normal range of motion. Neck supple.  Cardiovascular: Normal rate, regular rhythm and normal heart sounds.   Heart rate 88 on auscultation.   Pulmonary/Chest: Effort normal and breath sounds normal.  Musculoskeletal: Normal range of motion.  Neurological: He is alert and oriented to person, place, and time.  Skin: Skin is warm and dry.  Psychiatric: He has a normal mood and affect. His behavior is normal. Judgment and thought content normal.  Vitals reviewed.   BP 127/84 mmHg  Pulse 108  Temp(Src) 98.4 F (36.9 C) (Oral)  Resp 16  Ht 6' (1.829 m)  Wt 227 lb (102.967 kg)  BMI 30.78 kg/m2  SpO2 96% HgA1C 6.5 (was 5.9 7/15)    Assessment & Plan:  1. Elevated LFTs - Comprehensive metabolic panel  2. Hypertriglyceridemia - Lipid panel  3. Type 2 diabetes mellitus with hyperglycemia - POCT glycosylated hemoglobin (Hb A1C) - Discussed results of HbA1C with patient, continue metformin 500 mg  BID and work on diet/exercise/weight loss  - follow up in 6 months  Emi Belfasteborah B. Ambika Zettlemoyer, FNP-BC  [facility], [facility]  01/26/2015 1:52 PM

## 2015-05-11 ENCOUNTER — Ambulatory Visit: Payer: Self-pay | Admitting: Family Medicine

## 2015-05-27 ENCOUNTER — Ambulatory Visit (INDEPENDENT_AMBULATORY_CARE_PROVIDER_SITE_OTHER): Payer: Self-pay | Admitting: Emergency Medicine

## 2015-05-27 ENCOUNTER — Ambulatory Visit (INDEPENDENT_AMBULATORY_CARE_PROVIDER_SITE_OTHER): Payer: Self-pay

## 2015-05-27 VITALS — BP 128/76 | HR 83 | Temp 98.5°F | Resp 16 | Ht 72.0 in | Wt 233.6 lb

## 2015-05-27 DIAGNOSIS — Z23 Encounter for immunization: Secondary | ICD-10-CM

## 2015-05-27 DIAGNOSIS — S5001XA Contusion of right elbow, initial encounter: Secondary | ICD-10-CM

## 2015-05-27 DIAGNOSIS — S59901A Unspecified injury of right elbow, initial encounter: Secondary | ICD-10-CM

## 2015-05-27 DIAGNOSIS — S51011A Laceration without foreign body of right elbow, initial encounter: Secondary | ICD-10-CM

## 2015-05-27 DIAGNOSIS — S5000XA Contusion of unspecified elbow, initial encounter: Secondary | ICD-10-CM | POA: Insufficient documentation

## 2015-05-27 DIAGNOSIS — S51019A Laceration without foreign body of unspecified elbow, initial encounter: Secondary | ICD-10-CM | POA: Insufficient documentation

## 2015-05-27 NOTE — Progress Notes (Signed)
    MRN: [id number] DOB: February 16, 1971  Subjective:   Calvin Wells is a 44 y.o. male presenting for chief complaint of Elbow Injury  Reports that he was working with a piece of wood, it tore apart and suffered a laceration from a metal piece stuck on the piece of wood. Patient bled profusely, washed his wound out, applied a bandage and came to our clinic. Currently reports pain over wound. He denies loss of ROM, decreased sensation, weakness, numbness or tingling. Denies any other aggravating or relieving factors, no other questions or concerns.  Calvin Wells has a current medication list which includes the following prescription(s): gemfibrozil and metformin. He has No Known Allergies.  Calvin Wells  has a past medical history of Diabetes mellitus without complication. Also  has past surgical history that includes Stomach foreign body removal.  ROS As in subjective.  Objective:   Vitals: BP 128/76 mmHg  Pulse 83  Temp(Src) 98.5 F (36.9 C) (Oral)  Resp 16  Ht 6' (1.829 m)  Wt 233 lb 9.6 oz (105.96 kg)  BMI 31.67 kg/m2  SpO2 96%  Physical Exam  Constitutional: He is oriented to person, place, and time. He appears well-developed and well-nourished.  Cardiovascular: Normal rate.   Pulmonary/Chest: Effort normal.  Musculoskeletal:       Right forearm: He exhibits tenderness, swelling and laceration. He exhibits no bony tenderness, no edema and no deformity.       Arms: Neurological: He is alert and oriented to person, place, and time.  Skin: Skin is warm and dry. No rash noted. No erythema. No pallor.   [facility] reading (PRIMARY) by  Dr. Dareen Piano and PA-Arieal Cuoco. Right elbow - no metallic foreign body visualized. No fracture or dislocation.  PROCEDURE NOTE: laceration repair Verbal consent obtained from patient.  Local anesthesia with 5cc Lidocaine 2% with epinephrine.  Wound explored for tendon, ligament damage, no foreign body observed. Wound scrubbed with soap and water and rinsed. Wound closed with #3  4-0 Prolene horizontal mattress sutures.  Wound cleansed and dressed.  Assessment and Plan :   1. Elbow laceration, right, initial encounter 2. Elbow injury, right, initial encounter 3. Elbow contusion, right, initial encounter - Stable, physical exam findings and x-ray reassuring - Counseled patient on signs of infection, rtc if these develop - RTC in 10-14 days for suture removal  4. Need for Tdap vaccination - Tdap vaccine greater than or equal to 7yo IM  Wallis Bamberg, PA-C [facility] [phone] 05/27/2015 3:38 PM

## 2015-05-27 NOTE — Progress Notes (Signed)
  Medical screening examination/treatment/procedure(s) were performed by non-physician practitioner and as supervising physician I was immediately available for consultation/collaboration.     

## 2015-05-27 NOTE — Patient Instructions (Signed)

## 2015-06-10 ENCOUNTER — Ambulatory Visit: Payer: Self-pay | Admitting: Family Medicine

## 2015-06-10 VITALS — BP 122/74 | HR 88 | Temp 98.7°F | Resp 18

## 2015-06-10 DIAGNOSIS — S51011D Laceration without foreign body of right elbow, subsequent encounter: Secondary | ICD-10-CM

## 2015-06-10 DIAGNOSIS — Z4802 Encounter for removal of sutures: Secondary | ICD-10-CM

## 2015-06-10 NOTE — Progress Notes (Signed)
   Subjective:    Patient ID: Calvin Wells, male    DOB: 08-Apr-1971, 44 y.o.   MRN: [id number]  HPI This is a pleasant 44 yo male who presents today for suture removal. He had 3 horizontal mattress sutures applied for right elbow laceration. He has not had any pain, swelling or drainage of wound. He has full ROM of elbow.   Review of Systems As per HPI    Objective:   Physical Exam  Constitutional: He is oriented to person, place, and time. He appears well-developed and well-nourished. No distress.  HENT:  Head: Normocephalic and atraumatic.  Eyes: Conjunctivae are normal.  Neck: Normal range of motion.  Cardiovascular: Normal rate.   Pulmonary/Chest: Effort normal.  Musculoskeletal: Normal range of motion.  Neurological: He is alert and oriented to person, place, and time.  Skin: Skin is warm and dry. He is not diaphoretic.  Psychiatric: He has a normal mood and affect. His behavior is normal. Judgment and thought content normal.  Vitals reviewed.  BP 122/74 mmHg  Pulse 88  Temp(Src) 98.7 F (37.1 C) (Oral)  Resp 18  SpO2 97% Wound clean and dry. Moderate amount scabbing. 3 horizontal mattress sutures remove. Wound edges well approximated.     Assessment & Plan:  1. Elbow laceration, right, subsequent encounter - good wound healing, sutures removed - follow up PRN   Olean Ree, FNP-BC  [facility], [facility]  06/10/2015 4:34 PM

## 2015-11-06 NOTE — Telephone Encounter (Signed)
Error

## 2016-01-24 IMAGING — CR DG ELBOW COMPLETE 3+V*R*
3 series · 3 of 3 positions shown · non-contrast
Comparison: None.

CLINICAL DATA: Initial encounter: Pain and laceration following
injury

EXAM:
RIGHT ELBOW - COMPLETE 3+ VIEW

[AP]
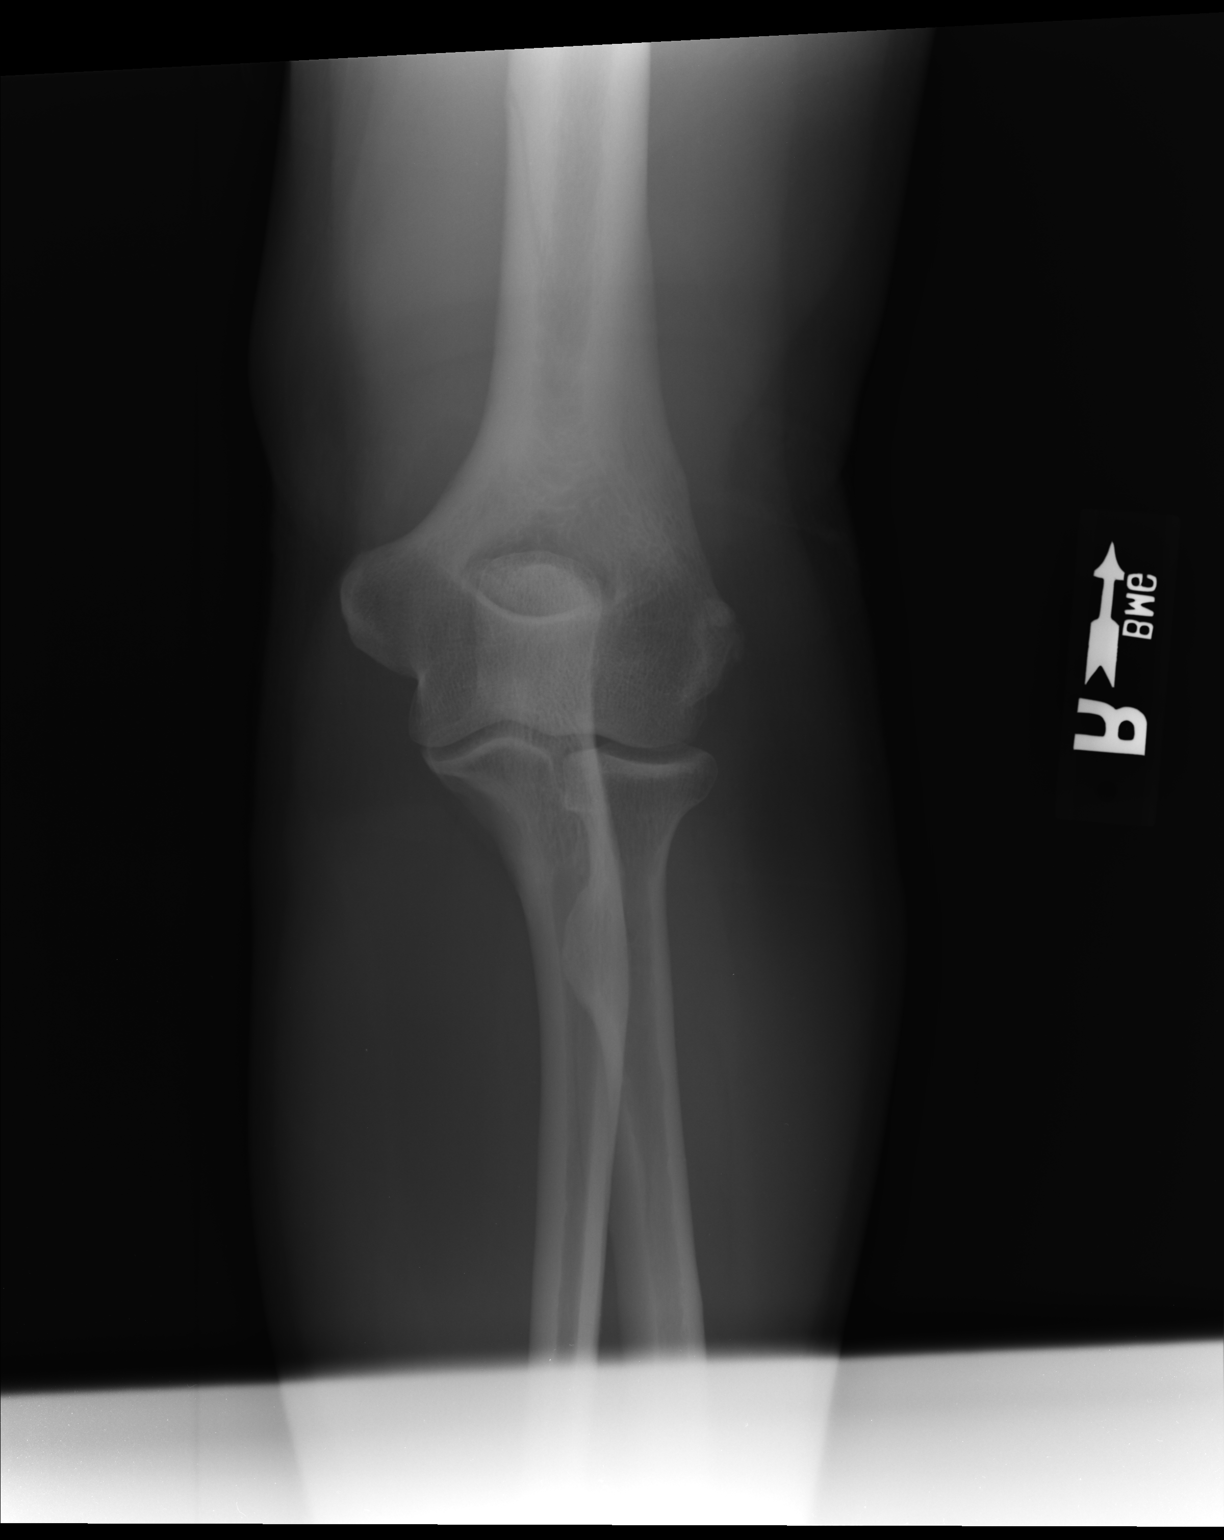

[ap obl ext rot]
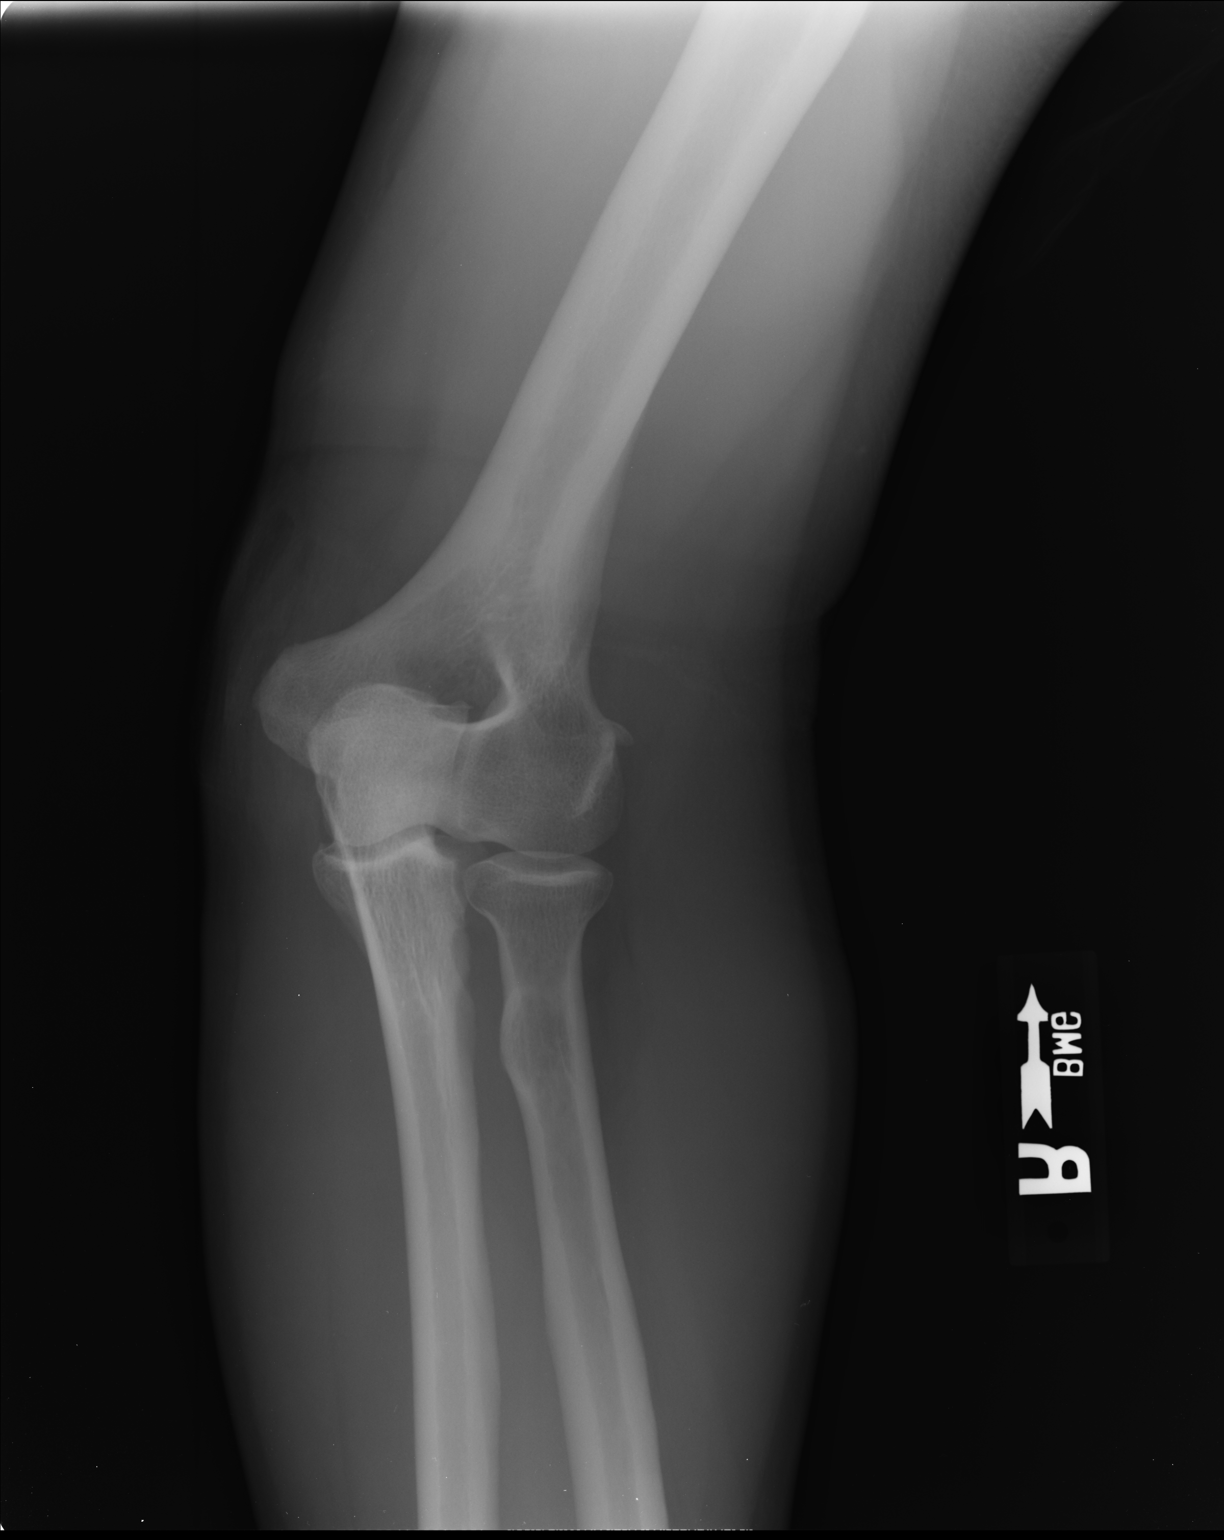

[lateral]
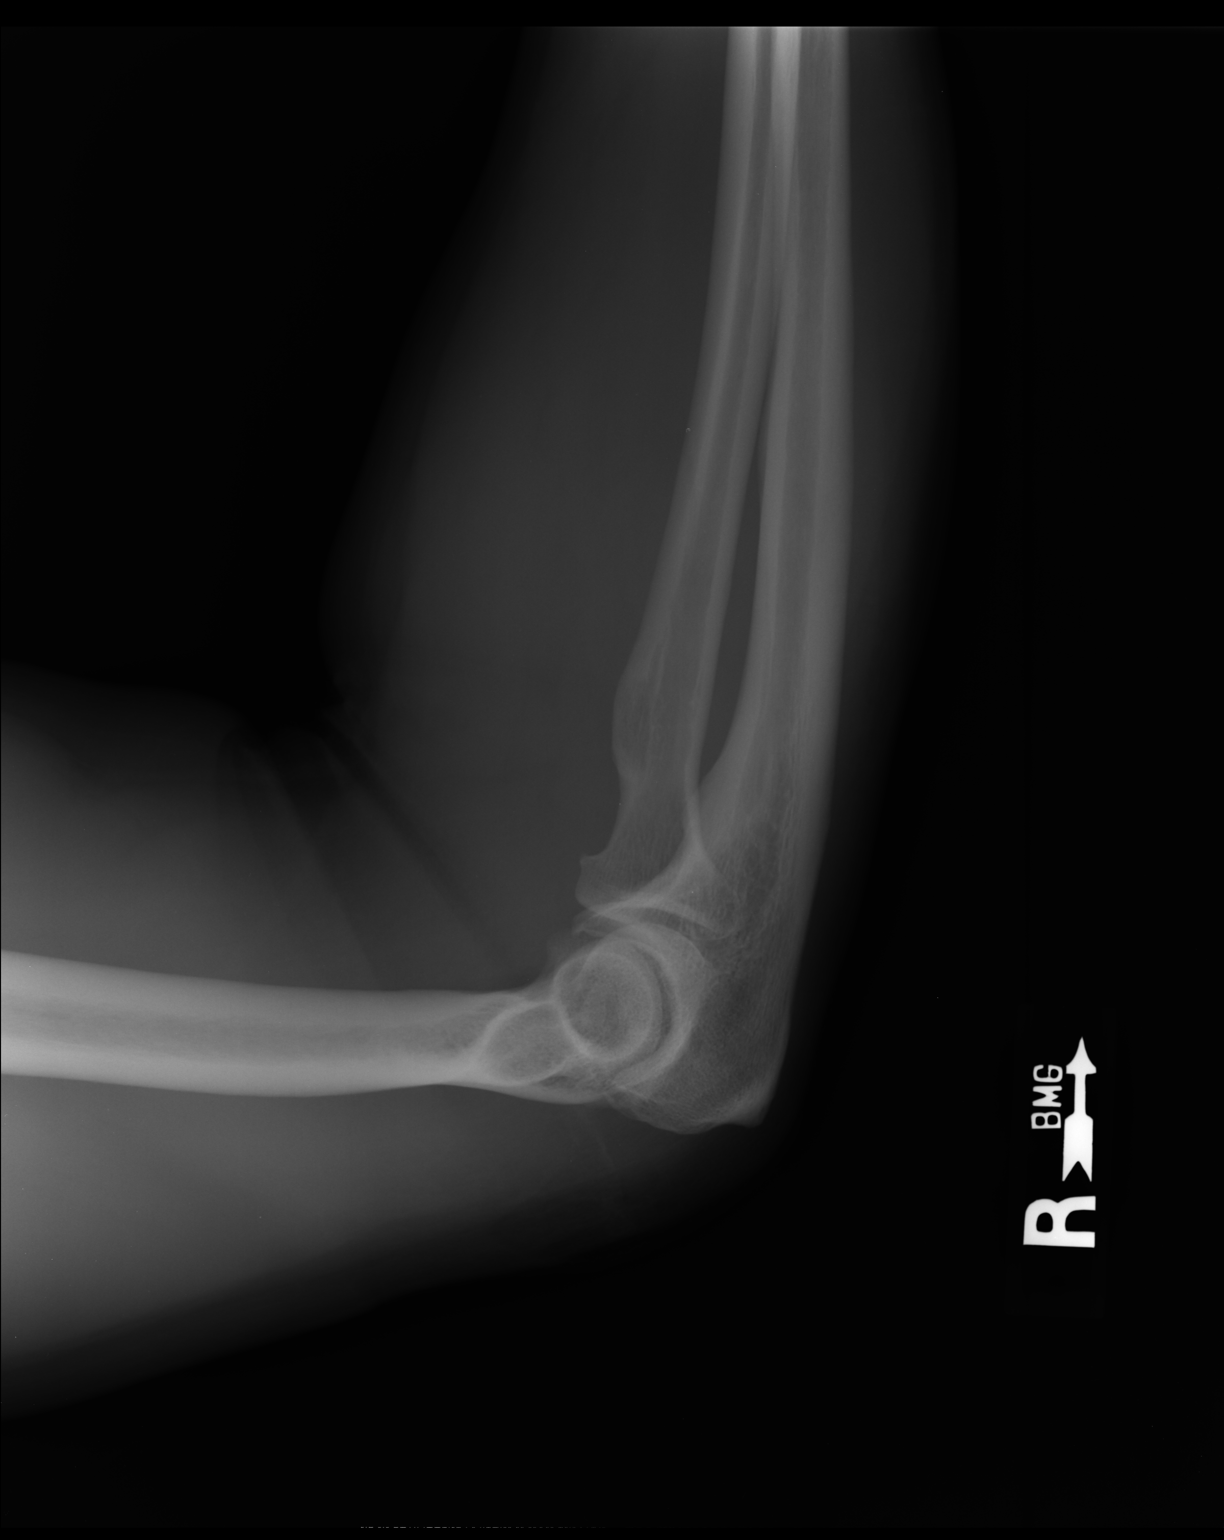

[3 of 3 positions shown; findings below may reference images not displayed]

FINDINGS: Frontal, oblique, and lateral views obtained. There is no
demonstrable fracture or dislocation. No joint effusion. There is a
focal area of calcification along the lateral distal humeral
condyle. No radiopaque foreign body. No erosions or appreciable
joint space narrowing
IMPRESSION: Probable calcific tendinosis laterally. No fracture or dislocation.
No radiopaque foreign body. No appreciable joint space narrowing.

## 2016-05-25 ENCOUNTER — Ambulatory Visit (INDEPENDENT_AMBULATORY_CARE_PROVIDER_SITE_OTHER): Payer: BLUE CROSS/BLUE SHIELD | Admitting: Physician Assistant

## 2016-05-25 VITALS — BP 126/74 | HR 77 | Temp 98.6°F | Resp 16 | Ht 71.65 in | Wt 227.0 lb

## 2016-05-25 DIAGNOSIS — S61214A Laceration without foreign body of right ring finger without damage to nail, initial encounter: Secondary | ICD-10-CM

## 2016-05-25 NOTE — Progress Notes (Signed)
   05/25/2016 5:18 PM   DOB: [birth year] / MRN: [id number]  SUBJECTIVE:  Calvin Wells is a 45 y.o. male presenting for a laceration to the right dorsal ring finger.  Reports he was using a knife and his hand slipped.  He wrapped the wound and came directly here. He denies a change in function of the finger.  Denies change in sensation of the finger.    He has No Known Allergies.   He  has a past medical history of Diabetes mellitus without complication (HCC).    He  reports that he has quit smoking. His smoking use included Cigarettes. He has a 24.00 pack-year smoking history. He has never used smokeless tobacco. He reports that he does not drink alcohol or use drugs. He  reports that he does not engage in sexual activity. The patient  has a past surgical history that includes Stomach foreign body removal.  His family history includes Diabetes in his mother and sister; Heart disease in his father.  Review of Systems  Constitutional: Negative for chills and fever.  Respiratory: Negative for cough.   Cardiovascular: Negative for chest pain.  Gastrointestinal: Negative for nausea.  Skin: Negative for itching and rash.  Neurological: Negative for dizziness.    Problem list and medications reviewed and updated by myself where necessary, and exist elsewhere in the encounter.   OBJECTIVE:  BP 126/74 (BP Location: Right Arm, Patient Position: Sitting, Cuff Size: Large)   Pulse 77   Temp 98.6 F (37 C)   Resp 16   Ht 5' 11.65" (1.82 m)   Wt 227 lb (103 kg)   SpO2 97%   BMI 31.08 kg/m   Physical Exam  Constitutional: He is oriented to person, place, and time. He appears well-developed and well-nourished.  Musculoskeletal:       Hands: Neurological: He is alert and oriented to person, place, and time.   Risk and benefits discussed and verbal consent obtained. Anesthetic allergies reviewed. Patient anesthetized using 1:1 mix of 2% lidocaine without epi and Marcaine. The wound was cleansed  thoroughly with soap and water. Sterile prep and drape. Wound closed with 1 simple continuous using 5-0 prolene suture material. Hemostasis achieved. Mupirocin applied to the wound and bandage placed. The patient tolerated well. Wound instructions were provided and the patient is to return in 10 days for suture removal.   No results found for this or any previous visit (from the past 72 hour(s)).  No results found.  ASSESSMENT AND PLAN  Calvin Wells was seen today for laceration.  Diagnoses and all orders for this visit:  Laceration of right ring finger w/o foreign body w/o damage to nail, initial encounter: Repaired.  RTC in 10 days for suture removal.     The patient was advised to call or return to clinic if he does not see an improvement in symptoms, or to seek the care of the closest emergency department if he worsens with the above plan.   Deliah Boston, MHS, PA-C [facility] 05/25/2016 5:18 PM

## 2016-05-25 NOTE — Patient Instructions (Addendum)
   IF you received an x-ray today, you will receive an invoice from [facility]. Please contact [facility] at [phone] with questions or concerns regarding your invoice.   IF you received labwork today, you will receive an invoice from [facility]. Please contact [facility] at [phone] with questions or concerns regarding your invoice.   Our billing staff will not be able to assist you with questions regarding bills from these companies.  You will be contacted with the lab results as soon as they are available. The fastest way to get your results is to activate your My Chart account. Instructions are located on the last page of this paperwork. If you have not heard from us regarding the results in 2 weeks, please contact this office.      Laceration Care, Adult A laceration is a cut that goes through all of the layers of the skin and into the tissue that is right under the skin. Some lacerations heal on their own. Others need to be closed with stitches (sutures), staples, skin adhesive strips, or skin glue. Proper laceration care minimizes the risk of infection and helps the laceration to heal better. HOW TO CARE FOR YOUR LACERATION If sutures or staples were used:  Keep the wound clean and dry.  If you were given a bandage (dressing), you should change it at least one time per day or as told by your health care provider. You should also change it if it becomes wet or dirty.  Keep the wound completely dry for the first 24 hours or as told by your health care provider. After that time, you may shower or bathe. However, make sure that the wound is not soaked in water until after the sutures or staples have been removed.  Clean the wound one time each day or as told by your health care provider:  Wash the wound with soap and water.  Rinse the wound with water to remove all soap.  Pat the wound dry with a clean towel. Do not rub the  wound.  After cleaning the wound, apply a thin layer of antibiotic ointmentas told by your health care provider. This will help to prevent infection and keep the dressing from sticking to the wound.  Have the sutures or staples removed as told by your health care provider. If skin adhesive strips were used:  Keep the wound clean and dry.  If you were given a bandage (dressing), you should change it at least one time per day or as told by your health care provider. You should also change it if it becomes dirty or wet.  Do not get the skin adhesive strips wet. You may shower or bathe, but be careful to keep the wound dry.  If the wound gets wet, pat it dry with a clean towel. Do not rub the wound.  Skin adhesive strips fall off on their own. You may trim the strips as the wound heals. Do not remove skin adhesive strips that are still stuck to the wound. They will fall off in time. If skin glue was used:  Try to keep the wound dry, but you may briefly wet it in the shower or bath. Do not soak the wound in water, such as by swimming.  After you have showered or bathed, gently pat the wound dry with a clean towel. Do not rub the wound.  Do not do any activities that will make you sweat heavily until the skin glue   has fallen off on its own.  Do not apply liquid, cream, or ointment medicine to the wound while the skin glue is in place. Using those may loosen the film before the wound has healed.  If you were given a bandage (dressing), you should change it at least one time per day or as told by your health care provider. You should also change it if it becomes dirty or wet.  If a dressing is placed over the wound, be careful not to apply tape directly over the skin glue. Doing that may cause the glue to be pulled off before the wound has healed.  Do not pick at the glue. The skin glue usually remains in place for 5-10 days, then it falls off of the skin. General Instructions  Take  over-the-counter and prescription medicines only as told by your health care provider.  If you were prescribed an antibiotic medicine or ointment, take or apply it as told by your doctor. Do not stop using it even if your condition improves.  To help prevent scarring, make sure to cover your wound with sunscreen whenever you are outside after stitches are removed, after adhesive strips are removed, or when glue remains in place and the wound is healed. Make sure to wear a sunscreen of at least 30 SPF.  Do not scratch or pick at the wound.  Keep all follow-up visits as told by your health care provider. This is important.  Check your wound every day for signs of infection. Watch for:  Redness, swelling, or pain.  Fluid, blood, or pus.  Raise (elevate) the injured area above the level of your heart while you are sitting or lying down, if possible. SEEK MEDICAL CARE IF:  You received a tetanus shot and you have swelling, severe pain, redness, or bleeding at the injection site.  You have a fever.  A wound that was closed breaks open.  You notice a bad smell coming from your wound or your dressing.  You notice something coming out of the wound, such as wood or glass.  Your pain is not controlled with medicine.  You have increased redness, swelling, or pain at the site of your wound.  You have fluid, blood, or pus coming from your wound.  You notice a change in the color of your skin near your wound.  You need to change the dressing frequently due to fluid, blood, or pus draining from the wound.  You develop a new rash.  You develop numbness around the wound. SEEK IMMEDIATE MEDICAL CARE IF:  You develop severe swelling around the wound.  Your pain suddenly increases and is severe.  You develop painful lumps near the wound or on skin that is anywhere on your body.  You have a red streak going away from your wound.  The wound is on your hand or foot and you cannot properly  move a finger or toe.  The wound is on your hand or foot and you notice that your fingers or toes look pale or bluish.   This information is not intended to replace advice given to you by your health care provider. Make sure you discuss any questions you have with your health care provider.   Document Released: 10/17/2005 Document Revised: 03/03/2015 Document Reviewed: 10/13/2014 Elsevier Interactive Patient Education 2016 [employer]  

## 2023-04-07 DIAGNOSIS — Z125 Encounter for screening for malignant neoplasm of prostate: Secondary | ICD-10-CM | POA: Diagnosis not present

## 2023-04-07 DIAGNOSIS — Z13220 Encounter for screening for lipoid disorders: Secondary | ICD-10-CM | POA: Diagnosis not present

## 2023-04-07 DIAGNOSIS — Z131 Encounter for screening for diabetes mellitus: Secondary | ICD-10-CM | POA: Diagnosis not present

## 2023-04-07 DIAGNOSIS — G629 Polyneuropathy, unspecified: Secondary | ICD-10-CM | POA: Diagnosis not present

## 2023-04-07 DIAGNOSIS — Z8639 Personal history of other endocrine, nutritional and metabolic disease: Secondary | ICD-10-CM | POA: Diagnosis not present

## 2023-04-07 DIAGNOSIS — I160 Hypertensive urgency: Secondary | ICD-10-CM | POA: Diagnosis not present

## 2023-04-07 DIAGNOSIS — R5383 Other fatigue: Secondary | ICD-10-CM | POA: Diagnosis not present

## 2023-04-07 DIAGNOSIS — Z720 Tobacco use: Secondary | ICD-10-CM | POA: Diagnosis not present

## 2023-04-17 DIAGNOSIS — I10 Essential (primary) hypertension: Secondary | ICD-10-CM | POA: Diagnosis not present

## 2023-04-17 DIAGNOSIS — E782 Mixed hyperlipidemia: Secondary | ICD-10-CM | POA: Diagnosis not present

## 2023-04-17 DIAGNOSIS — E1165 Type 2 diabetes mellitus with hyperglycemia: Secondary | ICD-10-CM | POA: Diagnosis not present

## 2023-04-17 DIAGNOSIS — D510 Vitamin B12 deficiency anemia due to intrinsic factor deficiency: Secondary | ICD-10-CM | POA: Diagnosis not present

## 2023-05-18 DIAGNOSIS — I10 Essential (primary) hypertension: Secondary | ICD-10-CM | POA: Diagnosis not present

## 2023-05-18 DIAGNOSIS — E1165 Type 2 diabetes mellitus with hyperglycemia: Secondary | ICD-10-CM | POA: Diagnosis not present

## 2023-05-18 DIAGNOSIS — D510 Vitamin B12 deficiency anemia due to intrinsic factor deficiency: Secondary | ICD-10-CM | POA: Diagnosis not present

## 2023-07-19 DIAGNOSIS — I10 Essential (primary) hypertension: Secondary | ICD-10-CM | POA: Diagnosis not present

## 2023-07-19 DIAGNOSIS — E1165 Type 2 diabetes mellitus with hyperglycemia: Secondary | ICD-10-CM | POA: Diagnosis not present

## 2023-07-19 DIAGNOSIS — E781 Pure hyperglyceridemia: Secondary | ICD-10-CM | POA: Diagnosis not present

## 2023-07-20 DIAGNOSIS — I10 Essential (primary) hypertension: Secondary | ICD-10-CM | POA: Diagnosis not present

## 2023-07-20 DIAGNOSIS — K5900 Constipation, unspecified: Secondary | ICD-10-CM | POA: Diagnosis not present

## 2024-10-15 NOTE — Progress Notes (Signed)
 Assessment and Plan:   Assessment & Plan Influenza A Influenza-like illness Acute headache, dizziness, nasal congestion, mild cough, and low-grade fever.  Tamiflu prescribed to help with symptoms of influenza A.  Patient also advised to rotate Tylenol  or ibuprofen as needed per package instructions and encourage oral hydration.  Patient advised to seek additional care if he has any new or concerning symptoms.  Orders:   oseltamivir (TAMIFLU) 75 MG capsule; Take 1 capsule (75 mg total) by mouth every twelve (12) hours for 5 days.  Cough, unspecified type  Orders:   POCT SARS/FLU/RSV - RN OBTAIN  Dizziness  Orders:   POCT Glucose   Orthostatic blood pressure Orthostatic vital signs within normal limits.  Blood sugar at 236 mg/dL due to lapse in medication. Hyperglycemia may contribute to dizziness.Blood pressure at 149/86 mmHg due to lapse in medication. No symptoms of palpitations or shortness of breath. - Provided list of local primary care providers for hypertension management.  Did discuss the option setting.  Patient declined this time due to office.  A list of local providers given to patient for him to follow-up.    Patient/patient's family verbalizes understanding and is in agreement with plan at this time.  The following portions of the patient's history were reviewed and updated as appropriate: allergies, current medications, past family history, past medical history, past social history, past surgical history and problem list.   Subjective:   Patient ID: Calvin Wells is a 53 y.o. male Chief Complaint  Patient presents with   Headache   Dizziness   Nasal Congestion    Patient complains of headache, dizziness and congestion for 2 days.     History of Present Illness Calvin Wells is a 53 year old male with hypertension and diabetes who presents with headache, dizziness, and nasal congestion.He has had headache, positional dizziness, and nasal  congestion for 2 days. The dizziness occurs when changing positions such as getting out of bed and feels like he may lose his balance, without room spinning, vision changes, or limb weakness. He reports a subjective low-grade fever and mild cough. He denies nausea, vomiting, diarrhea, palpitations, and shortness of breath.He has been off his diabetes and hypertension medications for about a month after his primary care provider relocated and cannot recall the medication names.     Headache  This is a new problem. The current episode started yesterday. The problem occurs intermittently. The pain quality is similar to prior headaches. Associated symptoms include abdominal pain, coughing, dizziness, a fever, muscle aches, nausea, rhinorrhea and a sore throat. Pertinent negatives include no phonophobia, photophobia or vomiting.  Dizziness This is a new problem. The current episode started yesterday. The problem occurs intermittently. Associated symptoms include abdominal pain, coughing, a fever, headaches, nausea and a sore throat. Pertinent negatives include no chest pain or vomiting.    ROS: Negative unless otherwise noted in HPI.  Objective:   Vital Signs:  BP 149/86 (BP Site: R Arm, BP Position: Sitting)   Pulse 88   Temp 37.2 C (99 F) (Oral)   Resp 16   Ht 180.3 cm (5' 11)   Wt 88.5 kg (195 lb)   SpO2 97%   BMI 27.20 kg/m   Body mass index is 27.2 kg/m. No LMP for male patient.  Vitals:   10/15/24 1012 10/15/24 1024 10/15/24 1025 10/15/24 1026  Orthostatic BP:  133/80 147/86 132/78  Orthostatic Pulse:  89 89 89  BP Site: R Arm  BP Position: Sitting Supine Sitting Standing     Results for orders placed or performed in visit on 10/15/24  POCT Glucose  Result Value Ref Range   Glucose, POC 236 (A) 65 - 179 mg/dL   Glucose Strip Lot Num [id number]    Glucose Strip Exp 01/21/2026    Serial Number    POCT SARS/Flu/RSV  Result Value Ref Range   POCT SARS-CoV-2 NAA Negative  Negative   Influenza A Positive (A) Negative   Influenza B Negative Negative   POC Rapid RSV, NAA Negative Negative     Physical Exam Vitals and nursing note reviewed.  Constitutional:      General: He is not in acute distress.    Appearance: Normal appearance. He is normal weight. He is not ill-appearing or toxic-appearing.  HENT:     Head: Normocephalic.     Right Ear: Tympanic membrane, ear canal and external ear normal.     Left Ear: Tympanic membrane, ear canal and external ear normal.     Nose: Rhinorrhea present.     Mouth/Throat:     Mouth: Mucous membranes are moist.     Pharynx: Oropharynx is clear.  Eyes:     Extraocular Movements: Extraocular movements intact.     Conjunctiva/sclera: Conjunctivae normal.     Pupils: Pupils are equal, round, and reactive to light.  Cardiovascular:     Rate and Rhythm: Normal rate and regular rhythm.     Heart sounds: Normal heart sounds.  Pulmonary:     Effort: Pulmonary effort is normal. No respiratory distress.     Breath sounds: Normal breath sounds. No stridor. No wheezing, rhonchi or rales.  Chest:     Chest wall: No tenderness.  Abdominal:     General: Abdomen is flat.     Palpations: Abdomen is soft.  Musculoskeletal:        General: Normal range of motion.     Cervical back: Normal range of motion.  Skin:    General: Skin is warm and dry.     Findings: No rash.  Neurological:     General: No focal deficit present.     Mental Status: He is alert and oriented to person, place, and time. Mental status is at baseline.     GCS: GCS eye subscore is 4. GCS verbal subscore is 5. GCS motor subscore is 6.     Cranial Nerves: No cranial nerve deficit.     Sensory: No sensory deficit.     Motor: No weakness.     Coordination: Coordination normal.     Deep Tendon Reflexes: Reflexes normal.  Psychiatric:        Mood and Affect: Mood normal.        Behavior: Behavior normal.        Thought Content: Thought content normal.         Judgment: Judgment normal.       PHQ-2 Score: 0  PHQ-9 Score:    Screening complete, no depression identified / no further action needed today   Follow-up as Needed  and Follow-up with PCP  The use of abridge was used to help in the completion of this note. Patient was educated and verbalized consent of its use.   Disposition Upon Discharge:  1.  Routine symptom specific, illness specific and/or disease specific instructions were discussed with the patient and/or caregiver at length.  2.  The differential diagnosis for the patient's specific symptoms were reviewed and discussed with the patient/caregiver  at length; the patient/caregiver expressed understanding of all discussed and their relevant questions were all satisfactorily answered.  3.  Return to care should the presenting symptoms recur, persist, or worsen in any way; or in the alternative, if new symptoms or complaints develop.  4.  The patient and any family present were given verbal and/or written discharge instructions as clinically indicated and appropriate.  5.  Continue OTC medications as needed and tolerated for control of the currently reported symptoms, if no conflict with the currently prescribed medications.

## 2024-11-05 ENCOUNTER — Encounter (HOSPITAL_COMMUNITY): Payer: Self-pay | Admitting: *Deleted

## 2024-11-05 ENCOUNTER — Emergency Department (HOSPITAL_COMMUNITY)

## 2024-11-05 ENCOUNTER — Other Ambulatory Visit: Payer: Self-pay

## 2024-11-05 ENCOUNTER — Telehealth: Payer: Self-pay

## 2024-11-05 ENCOUNTER — Inpatient Hospital Stay (HOSPITAL_COMMUNITY)
Admission: EM | Admit: 2024-11-05 | Discharge: 2024-11-08 | DRG: 638 | Disposition: A | Discharge status: Home / Self Care | Attending: Internal Medicine | Admitting: Internal Medicine

## 2024-11-05 DIAGNOSIS — I10 Essential (primary) hypertension: Secondary | ICD-10-CM | POA: Diagnosis present

## 2024-11-05 DIAGNOSIS — Z79899 Other long term (current) drug therapy: Secondary | ICD-10-CM

## 2024-11-05 DIAGNOSIS — L03116 Cellulitis of left lower limb: Secondary | ICD-10-CM | POA: Diagnosis present

## 2024-11-05 DIAGNOSIS — L089 Local infection of the skin and subcutaneous tissue, unspecified: Principal | ICD-10-CM | POA: Diagnosis present

## 2024-11-05 DIAGNOSIS — E785 Hyperlipidemia, unspecified: Secondary | ICD-10-CM | POA: Diagnosis present

## 2024-11-05 DIAGNOSIS — E1142 Type 2 diabetes mellitus with diabetic polyneuropathy: Secondary | ICD-10-CM | POA: Diagnosis present

## 2024-11-05 DIAGNOSIS — Z833 Family history of diabetes mellitus: Secondary | ICD-10-CM | POA: Diagnosis not present

## 2024-11-05 DIAGNOSIS — L97529 Non-pressure chronic ulcer of other part of left foot with unspecified severity: Secondary | ICD-10-CM | POA: Diagnosis present

## 2024-11-05 DIAGNOSIS — E1165 Type 2 diabetes mellitus with hyperglycemia: Secondary | ICD-10-CM | POA: Diagnosis present

## 2024-11-05 DIAGNOSIS — Z87891 Personal history of nicotine dependence: Secondary | ICD-10-CM

## 2024-11-05 DIAGNOSIS — E11621 Type 2 diabetes mellitus with foot ulcer: Secondary | ICD-10-CM | POA: Diagnosis present

## 2024-11-05 DIAGNOSIS — E11628 Type 2 diabetes mellitus with other skin complications: Secondary | ICD-10-CM | POA: Diagnosis present

## 2024-11-05 DIAGNOSIS — Z8249 Family history of ischemic heart disease and other diseases of the circulatory system: Secondary | ICD-10-CM

## 2024-11-05 HISTORY — DX: Essential (primary) hypertension: I10

## 2024-11-05 HISTORY — DX: Polyneuropathy, unspecified: G62.9

## 2024-11-05 LAB — SEDIMENTATION RATE: Sed Rate: 44 mm/h — ABNORMAL HIGH (ref 0–20)

## 2024-11-05 LAB — CBC WITH DIFFERENTIAL/PLATELET
Abs Immature Granulocytes: 0.08 K/uL — ABNORMAL HIGH (ref 0.00–0.07)
Basophils Absolute: 0.1 K/uL (ref 0.0–0.1)
Basophils Relative: 1 %
Eosinophils Absolute: 0.1 K/uL (ref 0.0–0.5)
Eosinophils Relative: 1 %
HCT: 43.8 % (ref 39.0–52.0)
Hemoglobin: 15.5 g/dL (ref 13.0–17.0)
Immature Granulocytes: 1 %
Lymphocytes Relative: 20 %
Lymphs Abs: 2.6 K/uL (ref 0.7–4.0)
MCH: 32.4 pg (ref 26.0–34.0)
MCHC: 35.4 g/dL (ref 30.0–36.0)
MCV: 91.6 fL (ref 80.0–100.0)
Monocytes Absolute: 1.2 K/uL — ABNORMAL HIGH (ref 0.1–1.0)
Monocytes Relative: 9 %
Neutro Abs: 9.2 K/uL — ABNORMAL HIGH (ref 1.7–7.7)
Neutrophils Relative %: 68 %
Platelets: 274 K/uL (ref 150–400)
RBC: 4.78 MIL/uL (ref 4.22–5.81)
RDW: 12 % (ref 11.5–15.5)
WBC: 13.4 K/uL — ABNORMAL HIGH (ref 4.0–10.5)
nRBC: 0 % (ref 0.0–0.2)

## 2024-11-05 LAB — BASIC METABOLIC PANEL WITH GFR
Anion gap: 16 — ABNORMAL HIGH (ref 5–15)
BUN: 11 mg/dL (ref 6–20)
CO2: 23 mmol/L (ref 22–32)
Calcium: 9 mg/dL (ref 8.9–10.3)
Chloride: 94 mmol/L — ABNORMAL LOW (ref 98–111)
Creatinine, Ser: 0.82 mg/dL (ref 0.61–1.24)
GFR, Estimated: >60 mL/min
Glucose, Bld: 221 mg/dL — ABNORMAL HIGH (ref 70–99)
Potassium: 3.6 mmol/L (ref 3.5–5.1)
Sodium: 132 mmol/L — ABNORMAL LOW (ref 135–145)

## 2024-11-05 LAB — LACTIC ACID, PLASMA
Lactic Acid, Venous: 1.1 mmol/L (ref 0.5–1.9)
Lactic Acid, Venous: 1 mmol/L (ref 0.5–1.9)

## 2024-11-05 LAB — CBG MONITORING, ED: Glucose-Capillary: 284 mg/dL — ABNORMAL HIGH (ref 70–99)

## 2024-11-05 MED ORDER — VANCOMYCIN HCL IN DEXTROSE 1-5 GM/200ML-% IV SOLN
1000.0000 mg | Freq: Once | INTRAVENOUS | Status: AC
  Administered 2024-11-05: 1000 mg via INTRAVENOUS
  Filled 2024-11-05: qty 200

## 2024-11-05 MED ORDER — PIPERACILLIN-TAZOBACTAM 3.375 G IVPB
3.3750 g | Freq: Once | INTRAVENOUS | Status: AC
  Administered 2024-11-05: 3.375 g via INTRAVENOUS
  Filled 2024-11-05: qty 50

## 2024-11-05 MED ORDER — PROCHLORPERAZINE EDISYLATE 10 MG/2ML IJ SOLN: 5.0000 mg | Freq: Four times a day (QID) | INTRAMUSCULAR | Status: DC | PRN

## 2024-11-05 MED ORDER — MORPHINE SULFATE (PF) 4 MG/ML IV SOLN: 4.0000 mg | INTRAVENOUS | Status: DC | PRN

## 2024-11-05 MED ORDER — ACETAMINOPHEN 500 MG PO TABS: 500.0000 mg | ORAL_TABLET | Freq: Four times a day (QID) | ORAL | Status: DC | PRN

## 2024-11-05 MED ORDER — SODIUM CHLORIDE 0.9 % IV SOLN: INTRAVENOUS | Status: AC

## 2024-11-05 MED ORDER — MELATONIN 3 MG PO TABS: 6.0000 mg | ORAL_TABLET | Freq: Every evening | ORAL | Status: DC | PRN

## 2024-11-05 MED ORDER — POLYETHYLENE GLYCOL 3350 17 G PO PACK: 17.0000 g | PACK | Freq: Every day | ORAL | Status: DC | PRN

## 2024-11-05 MED ORDER — ENOXAPARIN SODIUM 40 MG/0.4ML IJ SOSY
40.0000 mg | PREFILLED_SYRINGE | INTRAMUSCULAR | Status: DC
  Administered 2024-11-06 – 2024-11-08 (×3): 40 mg via SUBCUTANEOUS
  Filled 2024-11-05 (×3): qty 0.4

## 2024-11-05 MED ORDER — OXYCODONE HCL 5 MG PO TABS: 5.0000 mg | ORAL_TABLET | ORAL | Status: DC | PRN

## 2024-11-05 NOTE — ED Provider Notes (Signed)
 " [facility] AT [facility] Provider Note   CSN: [id number] Arrival date & time: 11/05/24  1440     Patient presents with: Wound Check   Calvin Wells is a 54 y.o. male.  {Add pertinent medical, surgical, social history, OB history to HPI:1769} 54 year old male presents for evaluation of wound check.  Was sent in by primary care for worsening left foot wound.  States has had more pain and swelling than usual.  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  Denies any fevers chills or bodyaches.  Denies any other symptoms or concerns at this time.   Wound Check Pertinent negatives include no chest pain, no abdominal pain and no shortness of breath.       Prior to Admission medications  Medication Sig Start Date End Date Taking? Authorizing Provider  gemfibrozil  (LOPID ) 600 MG tablet Take 1 tablet (600 mg total) by mouth 2 (two) times daily before a meal. NO MORE REFILLS WITHOUT OFFICE VISIT - 2ND NOTICE Patient not taking: Reported on 05/27/2015 12/16/14   Allen Lauraine CROME, PA-C  metFORMIN  (GLUCOPHAGE ) 500 MG tablet Take 1 tablet (500 mg total) by mouth daily with breakfast. NO MORE REFILLS WITHOUT OFFICE VISIT - 2ND NOTICE Patient not taking: Reported on 05/27/2015 12/16/14   Allen Lauraine CROME, PA-C    Allergies: Patient has no known allergies.    Review of Systems  Constitutional:  Negative for chills and fever.  HENT:  Negative for ear pain and sore throat.   Eyes:  Negative for pain and visual disturbance.  Respiratory:  Negative for cough and shortness of breath.   Cardiovascular:  Negative for chest pain and palpitations.  Gastrointestinal:  Negative for abdominal pain and vomiting.  Genitourinary:  Negative for dysuria and hematuria.  Musculoskeletal:  Negative for arthralgias and back pain.       Admits left foot swelling, pain, wound  Skin:  Negative for color change and rash.  Neurological:  Negative for seizures and syncope.  All other systems reviewed and are  negative.   Updated Vital Signs BP (!) 147/87   Pulse 96   Temp 98.9 F (37.2 C) (Oral)   Resp 20   Ht 5' 11 (1.803 m)   Wt 89.4 kg   SpO2 96%   BMI 27.48 kg/m   Physical Exam Vitals and nursing note reviewed.  Constitutional:      General: He is not in acute distress.    Appearance: He is well-developed.  HENT:     Head: Normocephalic and atraumatic.  Eyes:     Conjunctiva/sclera: Conjunctivae normal.  Cardiovascular:     Rate and Rhythm: Normal rate and regular rhythm.     Heart sounds: No murmur heard. Pulmonary:     Effort: Pulmonary effort is normal. No respiratory distress.     Breath sounds: Normal breath sounds.  Abdominal:     Palpations: Abdomen is soft.     Tenderness: There is no abdominal tenderness.  Musculoskeletal:        General: Swelling and tenderness present.     Cervical back: Neck supple.     Comments: Is tender, there is blisters with sloughing skin over the first 4 toes with erythema, patient has pulses auscultated by Doppler  Skin:    General: Skin is warm and dry.     Capillary Refill: Capillary refill takes less than 2 seconds.  Neurological:     General: No focal deficit present.     Mental Status: He is alert.  Psychiatric:        Mood and Affect: Mood normal.     (all labs ordered are listed, but only abnormal results are displayed) Labs Reviewed  CBC WITH DIFFERENTIAL/PLATELET - Abnormal; Notable for the following components:      Result Value   WBC 13.4 (*)    Neutro Abs 9.2 (*)    Monocytes Absolute 1.2 (*)    Abs Immature Granulocytes 0.08 (*)    All other components within normal limits  BASIC METABOLIC PANEL WITH GFR - Abnormal; Notable for the following components:   Sodium 132 (*)    Chloride 94 (*)    Glucose, Bld 221 (*)    Anion gap 16 (*)    All other components within normal limits  CBG MONITORING, ED - Abnormal; Notable for the following components:   Glucose-Capillary 284 (*)    All other components within  normal limits  CULTURE, BLOOD (ROUTINE X 2)  CULTURE, BLOOD (ROUTINE X 2)  LACTIC ACID, PLASMA  LACTIC ACID, PLASMA  SEDIMENTATION RATE  C-REACTIVE PROTEIN    EKG: None  Radiology: DG Foot Complete Left Result Date: 11/05/2024 CLINICAL DATA:  Left foot/toe blisters.  History of diabetes. EXAM: LEFT FOOT - COMPLETE 3+ VIEW COMPARISON:  None Available. FINDINGS: Bandaging material overlies the forefoot and toes, which limits detailed evaluation of the underlying soft tissues. No evidence of acute osteolysis or erosive changes to suggest acute osteomyelitis. No acute fracture or dislocation. Mild degenerative changes of the first MTP joint. Calcaneal enthesopathy at the insertion of the Achilles tendon. Small plantar calcaneal spur. IMPRESSION: 1. No radiographic evidence of acute osteomyelitis at this time. MRI could be obtained for further evaluation if there is persistent clinical concern for osteomyelitis. 2. Bandaging material overlies the forefoot and toes, which limits detailed evaluation of the underlying soft tissues. 3. Calcaneal enthesopathy. Electronically Signed   By: Harrietta Sherry M.D.   On: 11/05/2024 16:12    {Document cardiac monitor, telemetry assessment procedure when appropriate:32947} Procedures   Medications Ordered in the ED  vancomycin  (VANCOCIN ) IVPB 1000 mg/200 mL premix (has no administration in time range)  piperacillin -tazobactam (ZOSYN ) IVPB 4.5 g (has no administration in time range)      {Click here for ABCD2, HEART and other calculators REFRESH Note before signing:1}                              Medical Decision Making Amount and/or Complexity of Data Reviewed Labs: ordered. Radiology: ordered.  Risk Prescription drug management.   ***  {Document critical care time when appropriate  Document review of labs and clinical decision tools ie CHADS2VASC2, etc  Document your independent review of radiology images and any outside records  Document  your discussion with family members, caretakers and with consultants  Document social determinants of health affecting pt's care  Document your decision making why or why not admission, treatments were needed:32947:::1}   Final diagnoses:  None    ED Discharge Orders     None        "

## 2024-11-05 NOTE — ED Triage Notes (Signed)
 Pt with blisters to all toes of left foot, noted Saturday night. Seen PCP and sent here. Pt has numbness due to neuropathy and DM.

## 2024-11-05 NOTE — ED Notes (Signed)
 Per Lab, phlebotomist drew one blood culture set (save set, no orders), the first lactic acid(save tube, no orders), 2 golds (save tubes, no orders), and the regular 4 tubes on this patient.   If blood cultures and lactics are ordered later, patient only will need one remaining set and the second lactic acid.

## 2024-11-05 NOTE — Telephone Encounter (Signed)
 Patient is scheduled to be seen in our office tomorrow. He came in today wanting someone to look at his foot because he stated that he was diabetic and had blisters and numbness of his left foot.  Patient was triaged by me. Patient took his bandages off of his foot and infection was present. Patient was advised to go to the ER immediately for treatment.   I used Zero form and gauze wrap to cover the infected foot. Patient was on the phone with his sister Grayce who confirmed that she was on the way to pick him up from the office and take him to the ER for treatment.   Patient advised to follow up with us  tomorrow morning and let us  know if he is able to come in for his appt tomorrow with Oneil Severin.  Will CC Oneil Severin in this message for FYI.

## 2024-11-05 NOTE — ED Notes (Signed)
 Pt wound of left foot cleansed with normal saline; wrapped in non-adherent dressing, wrapped in gauze. Dressing is signed and dated by this RN

## 2024-11-05 NOTE — H&P (Signed)
 " History and Physical  Calvin Wells FMW:[id number] DOB: [birth year] DOA: 11/05/2024  Referring physician: Dr. Gennaro, EDP  PCP: Patient, No Pcp Per  Outpatient Specialists: None Patient coming from: Home  Chief Complaint: Left foot infection.   HPI: Calvin Wells is a 54 y.o. male with medical history significant for hypertension, type 2 diabetes, diabetic polyneuropathy, hyperlipidemia, who presents to the ER due to left foot infection.  He initially presented at his PCPs office for the same and was referred to the ER for further evaluation.  Endorses blisters to all toes on his left foot which he noted on Saturday, 4 days ago.  He was wearing crocs.  Has no sensation in his feet.  In the ER, vital signs are stable.  Lab studies notable for leukocytosis 13.4 K.  Sed rate 44.  CRP pending.  Left foot x-ray revealed no radiographic evidence of acute osteomyelitis at this time.  MRI could be obtained for further evaluation given his persistent clinical concern for osteomyelitis.  His left foot wound was cleaned.  Peripheral blood cultures were obtained and the patient was started on broad-spectrum IV antibiotics, IV vancomycin  and Zosyn .  EDP requesting admission for further management of left foot wound infection.  Admitted by [facility], hospitalist service.  ED Course: Temperature 99.6.  BP 130/77, pulse 104, respiratory rate 17, O2 saturation 96% on room air.  Review of Systems: Review of systems as noted in the HPI. All other systems reviewed and are negative.   Past Medical History:  Diagnosis Date   Diabetes mellitus without complication (HCC)    Hypertension    Neuropathy    Past Surgical History:  Procedure Laterality Date   STOMACH FOREIGN BODY REMOVAL     swallowed a dart as a child    Social History:  reports that he has quit smoking. His smoking use included cigarettes. He has a 24 pack-year smoking history. He has never used smokeless tobacco. He reports that he does not drink alcohol  and does not use drugs.   Allergies[1]  Family History  Problem Relation Age of Onset   Diabetes Mother    Heart disease Father    Diabetes Sister       Prior to Admission medications  Medication Sig Start Date End Date Taking? Authorizing Provider  gemfibrozil  (LOPID ) 600 MG tablet Take 1 tablet (600 mg total) by mouth 2 (two) times daily before a meal. NO MORE REFILLS WITHOUT OFFICE VISIT - 2ND NOTICE Patient not taking: Reported on 05/27/2015 12/16/14   Allen Lauraine CROME, PA-C  metFORMIN  (GLUCOPHAGE ) 500 MG tablet Take 1 tablet (500 mg total) by mouth daily with breakfast. NO MORE REFILLS WITHOUT OFFICE VISIT - 2ND NOTICE Patient not taking: Reported on 05/27/2015 12/16/14   Allen Lauraine CROME, PA-C    Physical Exam: BP (!) 147/87   Pulse 96   Temp 98.9 F (37.2 C) (Oral)   Resp 20   Ht 5' 11 (1.803 m)   Wt 89.4 kg   SpO2 96%   BMI 27.48 kg/m   General: 54 y.o. year-old male well developed well nourished in no acute distress.  Alert and oriented x3. Cardiovascular: Regular rate and rhythm with no rubs or gallops.  No thyromegaly or JVD noted.  No lower extremity edema. 2/4 pulses in all 4 extremities. Respiratory: Clear to auscultation with no wheezes or rales. Good inspiratory effort. Abdomen: Soft nontender nondistended with normal bowel sounds x4 quadrants. Muskuloskeletal: No cyanosis, clubbing or edema noted bilaterally Neuro: CN  II-XII intact, strength, sensation, reflexes Skin: Left foot wound, blisters, with infection.   Psychiatry: Judgement and insight appear normal. Mood is appropriate for condition and setting          Labs on Admission:  Basic Metabolic Panel: Recent Labs  Lab 11/05/24 1721  NA 132*  K 3.6  CL 94*  CO2 23  GLUCOSE 221*  BUN 11  CREATININE 0.82  CALCIUM  9.0   Liver Function Tests: No results for input(s): AST, ALT, ALKPHOS, BILITOT, PROT, ALBUMIN in the last 168 hours. No results for input(s): LIPASE, AMYLASE in the  last 168 hours. No results for input(s): AMMONIA in the last 168 hours. CBC: Recent Labs  Lab 11/05/24 1721  WBC 13.4*  NEUTROABS 9.2*  HGB 15.5  HCT 43.8  MCV 91.6  PLT 274   Cardiac Enzymes: No results for input(s): CKTOTAL, CKMB, CKMBINDEX, TROPONINI in the last 168 hours.  BNP (last 3 results) No results for input(s): BNP in the last 8760 hours.  ProBNP (last 3 results) No results for input(s): PROBNP in the last 8760 hours.  CBG: Recent Labs  Lab 11/05/24 1521  GLUCAP 284*    Radiological Exams on Admission: DG Foot Complete Left Result Date: 11/05/2024 CLINICAL DATA:  Left foot/toe blisters.  History of diabetes. EXAM: LEFT FOOT - COMPLETE 3+ VIEW COMPARISON:  None Available. FINDINGS: Bandaging material overlies the forefoot and toes, which limits detailed evaluation of the underlying soft tissues. No evidence of acute osteolysis or erosive changes to suggest acute osteomyelitis. No acute fracture or dislocation. Mild degenerative changes of the first MTP joint. Calcaneal enthesopathy at the insertion of the Achilles tendon. Small plantar calcaneal spur. IMPRESSION: 1. No radiographic evidence of acute osteomyelitis at this time. MRI could be obtained for further evaluation if there is persistent clinical concern for osteomyelitis. 2. Bandaging material overlies the forefoot and toes, which limits detailed evaluation of the underlying soft tissues. 3. Calcaneal enthesopathy. Electronically Signed   By: Harrietta Sherry M.D.   On: 11/05/2024 16:12    EKG: I independently viewed the EKG done and my findings are as followed: None available at the time of this visit.  Assessment/Plan Present on Admission:  Left foot infection  Principal Problem:   Left foot infection  Left diabetic foot infection, POA Blisters present WBC 13.4, follow peripheral blood cultures x 2. Continue broad-spectrum IV antibiotics Continue IV fluid hydration. Monitor fever curve  and WBCs. Wound care specialist consulted for local wound care Follow sed rate and CRP Following left foot MRI to rule out osteomyelitis of the toes.  Type 2 diabetes with hyperglycemia Follow hemoglobin A1c Insulin  coverage, long-acting and short acting insulin . Heart healthy carb modified diet.  Diabetic polyneuropathy Resume home regimen.  Hypertension Resume home oral antihypertensive Close monitor vital signs.  Hyperlipidemia Resume home oral antilipids  Pseudohyponatremia Corrected sodium for serum glucose 134 mEq/L Currently on NS IV fluid hydration at 75% cc/h x 1 day Repeat BMP in the morning   Time: 75 minutes.    DVT prophylaxis: Subcu Lovenox  daily.  Code Status: Full code.  Family Communication: None at bedside.  Disposition Plan: Admitted to MedSurg unit.  Consults called: Wound care specialist.  Admission status: Inpatient status.   Status is: Inpatient The patient requires at least 2 midnights for further evaluation and treatment of present condition   Terry LOISE Hurst MD [facility] Pager [phone]  If 7PM-7AM, please contact night-coverage [email or web address] Password [email or web address]  11/05/2024, 9:05 PM      [  1] No Known Allergies  "

## 2024-11-06 ENCOUNTER — Ambulatory Visit: Payer: Self-pay | Admitting: Family Medicine

## 2024-11-06 ENCOUNTER — Inpatient Hospital Stay (HOSPITAL_COMMUNITY)

## 2024-11-06 DIAGNOSIS — L089 Local infection of the skin and subcutaneous tissue, unspecified: Secondary | ICD-10-CM | POA: Diagnosis not present

## 2024-11-06 LAB — CBC
HCT: 37.8 % — ABNORMAL LOW (ref 39.0–52.0)
Hemoglobin: 13.3 g/dL (ref 13.0–17.0)
MCH: 32.5 pg (ref 26.0–34.0)
MCHC: 35.2 g/dL (ref 30.0–36.0)
MCV: 92.4 fL (ref 80.0–100.0)
Platelets: 262 K/uL (ref 150–400)
RBC: 4.09 MIL/uL — ABNORMAL LOW (ref 4.22–5.81)
RDW: 12 % (ref 11.5–15.5)
WBC: 9.5 K/uL (ref 4.0–10.5)
nRBC: 0 % (ref 0.0–0.2)

## 2024-11-06 LAB — GLUCOSE, CAPILLARY
Glucose-Capillary: 252 mg/dL — ABNORMAL HIGH (ref 70–99)
Glucose-Capillary: 230 mg/dL — ABNORMAL HIGH (ref 70–99)
Glucose-Capillary: 229 mg/dL — ABNORMAL HIGH (ref 70–99)
Glucose-Capillary: 172 mg/dL — ABNORMAL HIGH (ref 70–99)

## 2024-11-06 LAB — BASIC METABOLIC PANEL WITH GFR
Anion gap: 7 (ref 5–15)
BUN: 11 mg/dL (ref 6–20)
CO2: 31 mmol/L (ref 22–32)
Calcium: 8.6 mg/dL — ABNORMAL LOW (ref 8.9–10.3)
Chloride: 96 mmol/L — ABNORMAL LOW (ref 98–111)
Creatinine, Ser: 0.82 mg/dL (ref 0.61–1.24)
GFR, Estimated: >60 mL/min
Glucose, Bld: 183 mg/dL — ABNORMAL HIGH (ref 70–99)
Potassium: 3.5 mmol/L (ref 3.5–5.1)
Sodium: 134 mmol/L — ABNORMAL LOW (ref 135–145)

## 2024-11-06 LAB — URINE DRUG SCREEN
Amphetamines: NEGATIVE
Barbiturates: NEGATIVE
Benzodiazepines: NEGATIVE
Cocaine: NEGATIVE
Fentanyl: NEGATIVE
Methadone Scn, Ur: NEGATIVE
Opiates: NEGATIVE
Tetrahydrocannabinol: POSITIVE — AB

## 2024-11-06 LAB — LIPID PANEL
Cholesterol: 277 mg/dL — ABNORMAL HIGH (ref 0–200)
HDL: 33 mg/dL — ABNORMAL LOW
LDL Cholesterol: UNDETERMINED mg/dL (ref 0–99)
Total CHOL/HDL Ratio: 8.3 ratio
Triglycerides: 643 mg/dL — ABNORMAL HIGH
VLDL: 129 mg/dL — ABNORMAL HIGH (ref 0–40)

## 2024-11-06 LAB — HIV ANTIBODY (ROUTINE TESTING W REFLEX): HIV Screen 4th Generation wRfx: NONREACTIVE

## 2024-11-06 LAB — MAGNESIUM: Magnesium: 2.4 mg/dL (ref 1.7–2.4)

## 2024-11-06 LAB — C-REACTIVE PROTEIN: CRP: 8.9 mg/dL — ABNORMAL HIGH

## 2024-11-06 LAB — LDL CHOLESTEROL, DIRECT: Direct LDL: 129 mg/dL — ABNORMAL HIGH (ref 0–99)

## 2024-11-06 LAB — HEMOGLOBIN A1C
Hgb A1c MFr Bld: 8.5 % — ABNORMAL HIGH (ref 4.8–5.6)
Mean Plasma Glucose: 197.25 mg/dL

## 2024-11-06 LAB — PHOSPHORUS: Phosphorus: 3 mg/dL (ref 2.5–4.6)

## 2024-11-06 MED ORDER — HYDRALAZINE HCL 20 MG/ML IJ SOLN: 10.0000 mg | Freq: Four times a day (QID) | INTRAMUSCULAR | Status: DC | PRN

## 2024-11-06 MED ORDER — INSULIN GLARGINE 100 UNIT/ML ~~LOC~~ SOLN
5.0000 [IU] | Freq: Two times a day (BID) | SUBCUTANEOUS | Status: DC
  Administered 2024-11-06 – 2024-11-07 (×3): 5 [IU] via SUBCUTANEOUS
  Filled 2024-11-06 (×6): qty 0.05

## 2024-11-06 MED ORDER — INSULIN ASPART 100 UNIT/ML IJ SOLN
0.0000 [IU] | Freq: Three times a day (TID) | INTRAMUSCULAR | Status: DC
  Administered 2024-11-06: 2 [IU] via SUBCUTANEOUS
  Administered 2024-11-06 (×2): 5 [IU] via SUBCUTANEOUS
  Administered 2024-11-07: 3 [IU] via SUBCUTANEOUS
  Administered 2024-11-07: 2 [IU] via SUBCUTANEOUS
  Administered 2024-11-07 – 2024-11-08 (×2): 3 [IU] via SUBCUTANEOUS
  Administered 2024-11-08: 2 [IU] via SUBCUTANEOUS
  Filled 2024-11-06 (×5): qty 1

## 2024-11-06 MED ORDER — VANCOMYCIN HCL 1500 MG/300ML IV SOLN
1500.0000 mg | Freq: Two times a day (BID) | INTRAVENOUS | Status: DC
  Administered 2024-11-06 – 2024-11-08 (×5): 1500 mg via INTRAVENOUS
  Filled 2024-11-06 (×6): qty 300

## 2024-11-06 MED ORDER — GABAPENTIN 100 MG PO CAPS
100.0000 mg | ORAL_CAPSULE | Freq: Three times a day (TID) | ORAL | Status: DC
  Administered 2024-11-06 – 2024-11-08 (×7): 100 mg via ORAL
  Filled 2024-11-06 (×7): qty 1

## 2024-11-06 MED ORDER — INSULIN ASPART 100 UNIT/ML IJ SOLN
0.0000 [IU] | Freq: Every day | INTRAMUSCULAR | Status: DC
  Administered 2024-11-06: 3 [IU] via SUBCUTANEOUS
  Filled 2024-11-06: qty 1

## 2024-11-06 MED ORDER — INSULIN ASPART 100 UNIT/ML IJ SOLN
5.0000 [IU] | Freq: Three times a day (TID) | INTRAMUSCULAR | Status: DC
  Administered 2024-11-06 – 2024-11-08 (×7): 5 [IU] via SUBCUTANEOUS
  Filled 2024-11-06 (×4): qty 1

## 2024-11-06 MED ORDER — SODIUM CHLORIDE 0.9% FLUSH: 3.0000 mL | INTRAVENOUS | Status: DC | PRN

## 2024-11-06 MED ORDER — MORPHINE SULFATE (PF) 2 MG/ML IV SOLN: 1.0000 mg | INTRAVENOUS | Status: DC | PRN

## 2024-11-06 MED ORDER — SODIUM CHLORIDE 0.9% FLUSH
3.0000 mL | Freq: Two times a day (BID) | INTRAVENOUS | Status: DC
  Administered 2024-11-06 – 2024-11-08 (×4): 3 mL via INTRAVENOUS

## 2024-11-06 MED ORDER — ATORVASTATIN CALCIUM 40 MG PO TABS
40.0000 mg | ORAL_TABLET | Freq: Every day | ORAL | Status: DC
  Administered 2024-11-06 – 2024-11-07 (×2): 40 mg via ORAL
  Filled 2024-11-06 (×2): qty 1

## 2024-11-06 MED ORDER — GADOBUTROL 1 MMOL/ML IV SOLN
9.0000 mL | Freq: Once | INTRAVENOUS | Status: AC | PRN
  Administered 2024-11-06: 9 mL via INTRAVENOUS

## 2024-11-06 MED ORDER — PIPERACILLIN-TAZOBACTAM 3.375 G IVPB
3.3750 g | Freq: Three times a day (TID) | INTRAVENOUS | Status: DC
  Administered 2024-11-06 – 2024-11-08 (×7): 3.375 g via INTRAVENOUS
  Filled 2024-11-06 (×7): qty 50

## 2024-11-06 MED ORDER — SODIUM CHLORIDE 0.9 % IV SOLN: 250.0000 mL | INTRAVENOUS | Status: AC | PRN

## 2024-11-06 NOTE — Plan of Care (Signed)
   Problem: Education: Goal: Knowledge of General Education information will improve Description: Including pain rating scale, medication(s)/side effects and non-pharmacologic comfort measures Outcome: Progressing   Problem: Clinical Measurements: Goal: Ability to maintain clinical measurements within normal limits will improve Outcome: Progressing Goal: Diagnostic test results will improve Outcome: Progressing

## 2024-11-06 NOTE — Progress Notes (Signed)
 " PROGRESS NOTE    Calvin Wells  FMW:[id number] DOB: 1971-02-14 DOA: 11/05/2024 PCP: Patient, No Pcp Per   Brief Narrative:   54 y.o. male with medical history significant for hypertension, type 2 diabetes, diabetic polyneuropathy, hyperlipidemia, who presents to the ER due to left foot infection.  He initially presented at his PCPs office for the same and was referred to the ER for further evaluation.  Endorses blisters to all toes on his left foot which he noted on Saturday, 4 days ago.   MRI ordered to r/o OM. Started on IV antibiotics F/u blood cultures   Assessment & Plan:  Principal Problem:   Left foot infection   Left diabetic foot cellulitis/possible osteomyelitis, POA  Follow up peripheral blood cultures x 2. Continue broad-spectrum IV antibiotics including Vancomycin  and Zosyn . Continue IV fluid hydration with NS at 75 cc/hr. Wound care specialist consulted for local wound care Follow sed rate and CRP-elevated Following left foot MRI to rule out osteomyelitis of the toes.   Type 2 diabetes with hyperglycemia as well as diabetic neuropathy and diabetic left foot ulceration/cellulitis,POA: Follow hemoglobin A1c-8.5% Insulin  coverage, long-acting and short acting insulin . Heart healthy carb modified diet.   Diabetic polyneuropathy Started on gabapentin  100 mg PO TID. Not on any meds at home   Hypertension Not on any meds at home PRN IV Hydralazine  10 mg Q6H for SBP > 160 mmHg   Hyperlipidemia On no meds at home. Ordered lipid panel   Pseudohyponatremia Corrected sodium for serum glucose 134 mEq/L Currently on NS IV fluid hydration at 75 cc/h x 1 day Repeat BMP in the morning  Disposition: Home. Lives alone.   DVT prophylaxis: enoxaparin  (LOVENOX ) injection 40 mg Start: 11/06/24 1000     Code Status: Full Code Family Communication:  None at the bedside Status is: Inpatient Remains inpatient appropriate because: Left foot diabetic  infection    Subjective:  No acute events overnight. He went for MRI foot this morning. He ran out of all his medications 3 months back.   Examination:  General exam: Appears calm and comfortable  Respiratory system: Clear to auscultation. Respiratory effort normal. Cardiovascular system: S1 & S2 heard, RRR. No JVD, murmurs, rubs, gallops or clicks. No pedal edema. Gastrointestinal system: Abdomen is nondistended, soft and nontender. No organomegaly or masses felt. Normal bowel sounds heard. Central nervous system: Alert and oriented. No focal neurological deficits. Extremities: Symmetric 5 x 5 power. Skin: Left foot dry hemorrhagic tip of toes, erythematous, warmth to left foot and lower left leg Psychiatry: Judgement and insight appear normal. Mood & affect appropriate.       Diet Orders (From admission, onward)     Start     Ordered   11/06/24 0118  Diet heart healthy/carb modified Room service appropriate? Yes; Fluid consistency: Thin  Diet effective now       Question Answer Comment  Diet-HS Snack? Nothing   Room service appropriate? Yes   Fluid consistency: Thin      11/06/24 0117            Objective: Vitals:   11/06/24 0100 11/06/24 0139 11/06/24 0140 11/06/24 0529  BP: (!) 153/84 (!) 150/88 (!) 159/88 139/78  Pulse: 81 85 85 84  Resp: 16 18 15 18   Temp:  98.3 F (36.8 C) 98.3 F (36.8 C) 98.8 F (37.1 C)  TempSrc:  Oral Oral Oral  SpO2: 94% 97% 98% 94%  Weight:      Height:  Intake/Output Summary (Last 24 hours) at 11/06/2024 0927 Last data filed at 11/06/2024 0109 Gross per 24 hour  Intake 50 ml  Output --  Net 50 ml   Filed Weights   11/05/24 1515  Weight: 89.4 kg    Scheduled Meds:  enoxaparin  (LOVENOX ) injection  40 mg Subcutaneous Q24H   insulin  aspart  0-15 Units Subcutaneous TID WC   insulin  aspart  0-5 Units Subcutaneous QHS   insulin  aspart  5 Units Subcutaneous TID WC   insulin  glargine  5 Units Subcutaneous BID   sodium  chloride flush  3 mL Intravenous Q12H   Continuous Infusions:  sodium chloride  75 mL/hr at 11/05/24 2247   sodium chloride      piperacillin -tazobactam (ZOSYN )  IV 3.375 g (11/06/24 0533)   vancomycin       Nutritional status     Body mass index is 27.48 kg/m.  Data Reviewed:   CBC: Recent Labs  Lab 11/05/24 1721 11/06/24 0428  WBC 13.4* 9.5  NEUTROABS 9.2*  --   HGB 15.5 13.3  HCT 43.8 37.8*  MCV 91.6 92.4  PLT 274 262   Basic Metabolic Panel: Recent Labs  Lab 11/05/24 1721 11/05/24 2113 11/06/24 0428  NA 132*  --  134*  K 3.6  --  3.5  CL 94*  --  96*  CO2 23  --  31  GLUCOSE 221*  --  183*  BUN 11  --  11  CREATININE 0.82  --  0.82  CALCIUM  9.0  --  8.6*  MG  --  2.4  --   PHOS  --  3.0  --    GFR: Estimated Creatinine Clearance: 108.4 mL/min (by C-G formula based on SCr of 0.82 mg/dL). Liver Function Tests: No results for input(s): AST, ALT, ALKPHOS, BILITOT, PROT, ALBUMIN in the last 168 hours. No results for input(s): LIPASE, AMYLASE in the last 168 hours. No results for input(s): AMMONIA in the last 168 hours. Coagulation Profile: No results for input(s): INR, PROTIME in the last 168 hours. Cardiac Enzymes: No results for input(s): CKTOTAL, CKMB, CKMBINDEX, TROPONINI in the last 168 hours. BNP (last 3 results) No results for input(s): PROBNP in the last 8760 hours. HbA1C: Recent Labs    11/05/24 2113  HGBA1C 8.5*   CBG: Recent Labs  Lab 11/05/24 1521 11/06/24 0151 11/06/24 0801  GLUCAP 284* 252* 230*   Lipid Profile: No results for input(s): CHOL, HDL, LDLCALC, TRIG, CHOLHDL, LDLDIRECT in the last 72 hours. Thyroid Function Tests: No results for input(s): TSH, T4TOTAL, FREET4, T3FREE, THYROIDAB in the last 72 hours. Anemia Panel: No results for input(s): VITAMINB12, FOLATE, FERRITIN, TIBC, IRON, RETICCTPCT in the last 72 hours. Sepsis Labs: Recent Labs  Lab  11/05/24 1721 11/05/24 2113  LATICACIDVEN 1.1 1.0    Recent Results (from the past 240 hours)  Culture, blood (routine x 2)     Status: None (Preliminary result)   Collection Time: 11/05/24  5:21 PM   Specimen: BLOOD  Result Value Ref Range Status   Specimen Description BLOOD RIGHT ANTECUBITAL  Final   Special Requests   Final    AEROBIC BOTTLE ONLY Blood Culture results may not be optimal due to an inadequate volume of blood received in culture bottles   Culture   Final    NO GROWTH < 12 HOURS Performed at [facility], [address] [place], [place] [address]    Report Status PENDING  Incomplete  Culture, blood (routine x 2)  Status: None (Preliminary result)   Collection Time: 11/05/24  9:13 PM   Specimen: BLOOD  Result Value Ref Range Status   Specimen Description BLOOD BLOOD RIGHT ARM  Final   Special Requests   Final    BOTTLES DRAWN AEROBIC AND ANAEROBIC Blood Culture adequate volume   Culture   Final    NO GROWTH < 12 HOURS Performed at [facility], [address], [place], [place] [address]    Report Status PENDING  Incomplete         Radiology Studies: DG Foot Complete Left Result Date: 11/05/2024 CLINICAL DATA:  Left foot/toe blisters.  History of diabetes. EXAM: LEFT FOOT - COMPLETE 3+ VIEW COMPARISON:  None Available. FINDINGS: Bandaging material overlies the forefoot and toes, which limits detailed evaluation of the underlying soft tissues. No evidence of acute osteolysis or erosive changes to suggest acute osteomyelitis. No acute fracture or dislocation. Mild degenerative changes of the first MTP joint. Calcaneal enthesopathy at the insertion of the Achilles tendon. Small plantar calcaneal spur. IMPRESSION: 1. No radiographic evidence of acute osteomyelitis at this time. MRI could be obtained for further evaluation if there is persistent clinical concern for osteomyelitis. 2. Bandaging material overlies the forefoot and toes, which limits detailed evaluation  of the underlying soft tissues. 3. Calcaneal enthesopathy. Electronically Signed   By: Harrietta Sherry M.D.   On: 11/05/2024 16:12           LOS: 1 day   Time spent= 35 mins    Deliliah Room, MD [facility]  If 7PM-7AM, please contact night-coverage  11/06/2024, 9:27 AM  "

## 2024-11-06 NOTE — Progress Notes (Signed)
 Pharmacy Antibiotic Note  Calvin Wells is a 54 y.o. male admitted on 11/05/2024 with blisters/numbness of left foot-with signs of infection.  Pharmacy has been consulted for zosyn /vancomycin  dosing for wound infection.  -WBC 13, afebrile, sCr ~bl -Blood cultures collected -XR shows no signs of osteomyelitis   Plan: -Zosyn  3.375gm IV every 8 hours -Vancomycin  1500mg  IV every 12 hours (AUC 496, IBW, Vd 0.72, sCr 0.82) -Monitor renal function -Follow up signs of clinical improvement, LOT, de-escalation of antibiotics   Height: 5' 11 (180.3 cm) Weight: 89.4 kg (197 lb) IBW/kg (Calculated) : 75.3  Temp (24hrs), Avg:99.3 F (37.4 C), Min:98.9 F (37.2 C), Max:99.6 F (37.6 C)  Recent Labs  Lab 11/05/24 1721 11/05/24 2113  WBC 13.4*  --   CREATININE 0.82  --   LATICACIDVEN 1.1 1.0    Estimated Creatinine Clearance: 108.4 mL/min (by C-G formula based on SCr of 0.82 mg/dL).    Allergies[1]  Antimicrobials this admission: Zosyn  1/6 >>  Vanc 1/6 >>   Microbiology results: 1/6 BCx:   Thank you for allowing pharmacy to be a part of this patients care.  Lynwood Poplar, PharmD, BCPS Clinical Pharmacist 11/06/2024 1:42 AM       [1] No Known Allergies

## 2024-11-06 NOTE — Progress Notes (Signed)
 Patient arrived via wc from ER. Patient changed into gown and fluids started as ordered. Patient a/o x 4 reports he has diabetic neuropathy and did not know his foot was infected until Saturday after removing his sock. Patient reports pinkness and blisters to all toes on left foot began Saturday night and he saw his PCP on Monday and was advised to go to er asap for further workup. Patient toes are red/yellow with skin sloughing. Foot was soaked and cleaned in ER. Non stick telfa and wet/dry dressing applied.Patient reports no pain at present time and cap refill greater then 3 seconds with 1+ dorsal pedal pulse to left foot and swelling. Will continue to treat and monitor as directed

## 2024-11-06 NOTE — Plan of Care (Signed)
   Problem: Education: Goal: Knowledge of General Education information will improve Description Including pain rating scale, medication(s)/side effects and non-pharmacologic comfort measures Outcome: Progressing   Problem: Education: Goal: Knowledge of General Education information will improve Description Including pain rating scale, medication(s)/side effects and non-pharmacologic comfort measures Outcome: Progressing

## 2024-11-06 NOTE — Progress Notes (Addendum)
 Patient PMH of Diabetic neuropathy admitted for Left Foot infection, possible Osteomyelitis, started on IV Zosyn  q8hrs and Vanc q12hrs.  Plan for MRI today to evaluate for Osteo   Discharge Plan for possible Home with IV Abx, however appears patient does not have a PCP. Inpatient Care Manager (ICM) conducted chart review and attempted to contact patient by phone to complete brief admission assessment, but did not get an answer.  Will attempt again later today.   UPDATE at 1:30: MRI results confirmed NO OSTEO in left foot, CM visited with patient to follow-up on D/C plan.  Patient confirmed he does not have a PCP but was in the process of exploring options when admitted.  Confirmed he, lives alone, is independent without need for assistive devices and denied any discharge needs.   CM added PCP list to AVS for patients reference  IPCM team will continue to follow along and monitor patient advancement through interdisciplinary progression rounds, If new patient transition needs arise, please enter a ICM consult to prompt IPCM team to follow up.    11/06/24 1156  TOC Brief Assessment  Insurance and Status Reviewed  Patient has primary care physician Yes  Home environment has been reviewed Home  Prior level of function: Independent  Prior/Current Home Services No current home services  Social Drivers of Health Review SDOH reviewed no interventions necessary  Readmission risk has been reviewed Yes  Transition of care needs transition of care needs identified, TOC will continue to follow

## 2024-11-06 NOTE — Progress Notes (Addendum)
 Nurse at bedside,patient going off floor to MRI.Continuous intravenous infusion on hold, while patient is off floor for procedure, per Dr Deliliah Room orders.Plan of care on going.

## 2024-11-06 NOTE — Progress Notes (Signed)
 Nurse at bedside,providing wound care per wound care and MD's orders.Dressing applied to left foot, patient tolerated procedure.No c/o pain or discomfort noted at this time.

## 2024-11-06 NOTE — Consult Note (Signed)
 WOC Nurse Consult Note: Reason for Consult: L foot wound  Wound type: full thickness L plantar foot, all digits and webspaces likely r/t neuropathy  Pressure Injury POA: NA not pressure  Measurement: see nursing flowsheet  Wound bed: dark dry hemorrhagic/nonviable tissue toes; mix of red and tan to plantar aspect  Drainage (amount, consistency, odor) appears tan drainage on old dressing  Periwound: erythema  Dressing procedure/placement/frequency: Cleanse toes, in between toes and plantar L foot with Vashe wound cleanser, do not rinse.  Weave Vashe moistened Kerlix in between toes then Cover entire area (toes and plantar foot) with Xeroform gauze Soila [phone]) and secure with dry gauze and Kerlix roll gauze.   Patient will require ongoing management of wounds by podiatry/orthopedics.  POC discussed with bedside nurse. WOC team will not follow. Reconsult if further needs arise.   Thank you,    Powell Bar MSN, RN-BC, [employer]

## 2024-11-06 NOTE — Discharge Instructions (Signed)
 [facility] [address]  [place], [place] [address]J [phone] Accepts most insurances  [facility] [address] [place], [place] [address] [phone] Accepts most insurances  [facility] [address] [place], [place] [address]  [phone] Must meet requirements  [facility] [address] [place], [place] [address] [phone] Accepts most insurances  [facility] [address]  [place], [place] [address] [phone] Accepts most insurances  [facility] [address]   [place], [place]   [phone] Accepts most insurances  [facility])  [phone] [address]  [place], [place] [address] [phone] Accepts most insurances  [facility] [address]  [place], [place] [address] [phone] Accepts most insurances  [facility] Department [address] [place], [place] [address] [phone] option 1 Accepts Medicaid and [facility] [address]  [place], [place] [address] [phone] Accepts most insurances  Benita Outhouse, MD [address] [place], [place] [address] [phone] Accepts most insurances  [facility] at [facility] [address]  [place], [place] [address] [phone] Accepts most insurances  [facility] [phone] [address] [place], [place] [address] [phone] Accepts most insurances  [place], [place] [address], [address] [place], [place] [address] [phone]  Accepts most insurances

## 2024-11-06 NOTE — Plan of Care (Signed)
   Problem: Education: Goal: Knowledge of General Education information will improve Description: Including pain rating scale, medication(s)/side effects and non-pharmacologic comfort measures Outcome: Progressing   Problem: Clinical Measurements: Goal: Will remain free from infection Outcome: Progressing Goal: Diagnostic test results will improve Outcome: Progressing

## 2024-11-07 ENCOUNTER — Other Ambulatory Visit (HOSPITAL_COMMUNITY): Payer: Self-pay

## 2024-11-07 ENCOUNTER — Telehealth (HOSPITAL_COMMUNITY): Payer: Self-pay

## 2024-11-07 DIAGNOSIS — L089 Local infection of the skin and subcutaneous tissue, unspecified: Secondary | ICD-10-CM | POA: Diagnosis not present

## 2024-11-07 LAB — CBC WITH DIFFERENTIAL/PLATELET
Abs Immature Granulocytes: 0.06 K/uL (ref 0.00–0.07)
Basophils Absolute: 0.1 K/uL (ref 0.0–0.1)
Basophils Relative: 1 %
Eosinophils Absolute: 0.2 K/uL (ref 0.0–0.5)
Eosinophils Relative: 2 %
HCT: 39.8 % (ref 39.0–52.0)
Hemoglobin: 13.8 g/dL (ref 13.0–17.0)
Immature Granulocytes: 1 %
Lymphocytes Relative: 23 %
Lymphs Abs: 2.6 K/uL (ref 0.7–4.0)
MCH: 32.5 pg (ref 26.0–34.0)
MCHC: 34.7 g/dL (ref 30.0–36.0)
MCV: 93.9 fL (ref 80.0–100.0)
Monocytes Absolute: 1.1 K/uL — ABNORMAL HIGH (ref 0.1–1.0)
Monocytes Relative: 10 %
Neutro Abs: 7.1 K/uL (ref 1.7–7.7)
Neutrophils Relative %: 63 %
Platelets: 276 K/uL (ref 150–400)
RBC: 4.24 MIL/uL (ref 4.22–5.81)
RDW: 11.9 % (ref 11.5–15.5)
WBC: 11.3 K/uL — ABNORMAL HIGH (ref 4.0–10.5)
nRBC: 0 % (ref 0.0–0.2)

## 2024-11-07 LAB — BASIC METABOLIC PANEL WITH GFR
Anion gap: 7 (ref 5–15)
BUN: 12 mg/dL (ref 6–20)
CO2: 29 mmol/L (ref 22–32)
Calcium: 8.7 mg/dL — ABNORMAL LOW (ref 8.9–10.3)
Chloride: 98 mmol/L (ref 98–111)
Creatinine, Ser: 0.97 mg/dL (ref 0.61–1.24)
GFR, Estimated: >60 mL/min
Glucose, Bld: 173 mg/dL — ABNORMAL HIGH (ref 70–99)
Potassium: 4.2 mmol/L (ref 3.5–5.1)
Sodium: 134 mmol/L — ABNORMAL LOW (ref 135–145)

## 2024-11-07 LAB — GLUCOSE, CAPILLARY
Glucose-Capillary: 138 mg/dL — ABNORMAL HIGH (ref 70–99)
Glucose-Capillary: 186 mg/dL — ABNORMAL HIGH (ref 70–99)
Glucose-Capillary: 195 mg/dL — ABNORMAL HIGH (ref 70–99)
Glucose-Capillary: 122 mg/dL — ABNORMAL HIGH (ref 70–99)
Glucose-Capillary: 125 mg/dL — ABNORMAL HIGH (ref 70–99)

## 2024-11-07 MED ORDER — LIVING WELL WITH DIABETES BOOK: Freq: Once | Status: AC

## 2024-11-07 MED ORDER — INSULIN GLARGINE 100 UNIT/ML ~~LOC~~ SOLN
7.0000 [IU] | Freq: Every day | SUBCUTANEOUS | Status: DC
  Filled 2024-11-07: qty 0.07

## 2024-11-07 NOTE — Progress Notes (Addendum)
 " PROGRESS NOTE    Calvin Wells  FMW:[id number] DOB: [birth year] DOA: 11/05/2024 PCP: Patient, No Pcp Per   Brief Narrative:   54 y.o. male with medical history significant for hypertension, type 2 diabetes, diabetic polyneuropathy, hyperlipidemia, who presents to the ER due to left foot infection.  He initially presented at his PCPs office for the same and was referred to the ER for further evaluation.  Endorses blisters to all toes on his left foot which he noted on Saturday, 4 days ago.   MRI ordered and ruled out OM. Started on IV antibiotics F/u blood cultures-no growth to date  Lives at home.  He will need prescriptions for all the medications including glucometer kit.   Assessment & Plan:  Principal Problem:   Left foot infection   Left diabetic foot cellulitis, osteomyelitis ruled out, POA  Follow up peripheral blood cultures x 2-no growth to date. Continue broad-spectrum IV antibiotics including Vancomycin  and Zosyn . Received intravenous fluids, now discontinued Wound care specialist consulted for local wound care Follow sed rate and CRP-elevated Following left foot MRI -no evidence of osteomyelitis   Type 2 diabetes with hyperglycemia as well as diabetic neuropathy and diabetic left foot ulceration/cellulitis,POA: Follow hemoglobin A1c-8.5% Insulin  coverage with long-acting (ordered insulin  Lantus  7 units at bedtime daily) and sliding scale insulin  Heart healthy carb modified diet. Needs prescription for insulin  as well as glucometer kit on discharge Consulted diabetes coordinator   Diabetic polyneuropathy Started on gabapentin  100 mg PO TID. Not on any meds at home   Hypertension Not on any meds at home PRN IV Hydralazine  10 mg Q6H for SBP > 160 mmHg   Hyperlipidemia On no meds at home. Started on Lipitor 40 mg p.o. daily.  Will need a prescription for that on discharge Repeat lipid panel in 2 to 3 months   Pseudohyponatremia Corrected sodium for serum glucose  134 mEq/L Received intravenous fluids Repeat BMP in the morning  Disposition: Home. Lives alone.   DVT prophylaxis: enoxaparin  (LOVENOX ) injection 40 mg Start: 11/06/24 1000     Code Status: Full Code Family Communication:  None at the bedside Status is: Inpatient Remains inpatient appropriate because: Left foot diabetic infection    Subjective:  No acute events overnight. We spoke about the results of the MRI.  He told me that he would need all of his medications prescribed because he ran out of his medications 3 months back.  He also needs a prescription for glucometer kit.  He told me that he is trying to get a PCP.   Examination:  General exam: Appears calm and comfortable  Respiratory system: Clear to auscultation. Respiratory effort normal. Cardiovascular system: S1 & S2 heard, RRR. No JVD, murmurs, rubs, gallops or clicks. No pedal edema. Gastrointestinal system: Abdomen is nondistended, soft and nontender. No organomegaly or masses felt. Normal bowel sounds heard. Central nervous system: Alert and oriented. No focal neurological deficits. Extremities: Symmetric 5 x 5 power. Skin: Left foot dry hemorrhagic tip of toes, erythematous, warmth to left foot and lower left leg Psychiatry: Judgement and insight appear normal. Mood & affect appropriate.       Diet Orders (From admission, onward)     Start     Ordered   11/06/24 0118  Diet heart healthy/carb modified Room service appropriate? Yes; Fluid consistency: Thin  Diet effective now       Question Answer Comment  Diet-HS Snack? Nothing   Room service appropriate? Yes   Fluid consistency: Thin  11/06/24 0117            Objective: Vitals:   11/06/24 1004 11/06/24 1453 11/06/24 2141 11/07/24 0436  BP: (!) 163/90 (!) 147/79 (!) 158/87 138/79  Pulse: 87 83 92 86  Resp: 18 18 20 18   Temp: 98.4 F (36.9 C) 98.1 F (36.7 C) 98.9 F (37.2 C) 98.3 F (36.8 C)  TempSrc: Oral Oral Oral Oral  SpO2: 96%  97% 95% 96%  Weight:      Height:        Intake/Output Summary (Last 24 hours) at 11/07/2024 0924 Last data filed at 11/06/2024 1828 Gross per 24 hour  Intake 778.53 ml  Output 700 ml  Net 78.53 ml   Filed Weights   11/05/24 1515  Weight: 89.4 kg    Scheduled Meds:  atorvastatin   40 mg Oral QHS   enoxaparin  (LOVENOX ) injection  40 mg Subcutaneous Q24H   gabapentin   100 mg Oral TID   insulin  aspart  0-15 Units Subcutaneous TID WC   insulin  aspart  0-5 Units Subcutaneous QHS   insulin  aspart  5 Units Subcutaneous TID WC   insulin  glargine  5 Units Subcutaneous BID   sodium chloride  flush  3 mL Intravenous Q12H   Continuous Infusions:  piperacillin -tazobactam (ZOSYN )  IV 3.375 g (11/07/24 0451)   vancomycin  1,500 mg (11/06/24 2056)    Nutritional status     Body mass index is 27.48 kg/m.  Data Reviewed:   CBC: Recent Labs  Lab 11/05/24 1721 11/06/24 0428 11/07/24 0446  WBC 13.4* 9.5 11.3*  NEUTROABS 9.2*  --  7.1  HGB 15.5 13.3 13.8  HCT 43.8 37.8* 39.8  MCV 91.6 92.4 93.9  PLT 274 262 276   Basic Metabolic Panel: Recent Labs  Lab 11/05/24 1721 11/05/24 2113 11/06/24 0428 11/07/24 0446  NA 132*  --  134* 134*  K 3.6  --  3.5 4.2  CL 94*  --  96* 98  CO2 23  --  31 29  GLUCOSE 221*  --  183* 173*  BUN 11  --  11 12  CREATININE 0.82  --  0.82 0.97  CALCIUM  9.0  --  8.6* 8.7*  MG  --  2.4  --   --   PHOS  --  3.0  --   --    GFR: Estimated Creatinine Clearance: 91.6 mL/min (by C-G formula based on SCr of 0.97 mg/dL). Liver Function Tests: No results for input(s): AST, ALT, ALKPHOS, BILITOT, PROT, ALBUMIN in the last 168 hours. No results for input(s): LIPASE, AMYLASE in the last 168 hours. No results for input(s): AMMONIA in the last 168 hours. Coagulation Profile: No results for input(s): INR, PROTIME in the last 168 hours. Cardiac Enzymes: No results for input(s): CKTOTAL, CKMB, CKMBINDEX, TROPONINI in the last 168  hours. BNP (last 3 results) No results for input(s): PROBNP in the last 8760 hours. HbA1C: Recent Labs    11/05/24 2113  HGBA1C 8.5*   CBG: Recent Labs  Lab 11/06/24 0151 11/06/24 0801 11/06/24 1135 11/06/24 2006 11/07/24 0723  GLUCAP 252* 230* 229* 172* 186*   Lipid Profile: Recent Labs    11/06/24 0428  CHOL 277*  HDL 33*  LDLCALC UNABLE TO CALCULATE IF TRIGLYCERIDE OVER 400 mg/dL  TRIG 356*  CHOLHDL 8.3  LDLDIRECT 129*   Thyroid Function Tests: No results for input(s): TSH, T4TOTAL, FREET4, T3FREE, THYROIDAB in the last 72 hours. Anemia Panel: No results for input(s): VITAMINB12, FOLATE, FERRITIN, TIBC, IRON,  RETICCTPCT in the last 72 hours. Sepsis Labs: Recent Labs  Lab 11/05/24 1721 11/05/24 2113  LATICACIDVEN 1.1 1.0    Recent Results (from the past 240 hours)  Culture, blood (routine x 2)     Status: None (Preliminary result)   Collection Time: 11/05/24  5:21 PM   Specimen: BLOOD  Result Value Ref Range Status   Specimen Description BLOOD RIGHT ANTECUBITAL  Final   Special Requests   Final    AEROBIC BOTTLE ONLY Blood Culture results may not be optimal due to an inadequate volume of blood received in culture bottles   Culture   Final    NO GROWTH 2 DAYS Performed at [facility], [address], [place], [place] [address]    Report Status PENDING  Incomplete  Culture, blood (routine x 2)     Status: None (Preliminary result)   Collection Time: 11/05/24  9:13 PM   Specimen: BLOOD  Result Value Ref Range Status   Specimen Description BLOOD BLOOD RIGHT ARM  Final   Special Requests   Final    BOTTLES DRAWN AEROBIC AND ANAEROBIC Blood Culture adequate volume   Culture   Final    NO GROWTH 2 DAYS Performed at [facility], [address], [place], [place] [address]    Report Status PENDING  Incomplete         Radiology Studies: MR FOOT LEFT W WO CONTRAST Result Date: 11/06/2024 EXAM: MRI of the left Foot with and  without contrast. 11/06/2024 08:59:05 AM TECHNIQUE: Multiplanar multisequence MRI of the left foot was performed with and without the administration of intravenous contrast. COMPARISON: 11/05/2024 CLINICAL HISTORY: Wound infection. FINDINGS: LISFRANC JOINT: Visualized Lisfranc ligament is intact. No significant Lisfranc interval widening or significant periligamentous edema. BONE MARROW: No acute fracture or aggressive marrow replacing lesion. No abnormal marrow edema or significant abnormal marrow enhancement in the midfoot or forefoot to indicate osteomyelitis. GREATER AND LESSER MTP JOINTS: No significant joint effusion or osseous erosions. No significant degenerative changes. Normal alignment. SOFT TISSUES: Dorsal subcutaneous edema in the forefoot is nonspecific, cellulitis not excluded. Medial and lateral subcutaneous edema in the ankle region. The patient has known blistering/wounds along the distal and plantar great toe, medial second toe, and along the third toe as well as along the medial ball of the foot, only faintly appreciable on MRI. Mildly accentuated cutaneous and subcutaneous enhancement in the great toe and second toe and along the nail bed of the third toe, likely inflammatory, although cellulitis is not excluded. No drainable abscess. TENDONS: Flexor hallucis longus tenosynovitis just proximal to the nut of Henry. Visualized extensor tendons are intact without tenosynovitis. IMPRESSION: 1. Cutaneous and subcutaneous enhancement involving the great toe, second toe, and the nail bed of the third toe, likely inflammatory, with cellulitis not excluded, and no drainable abscess. 2. No osteomyelitis in the midfoot or forefoot. 3. Flexor hallucis longus tenosynovitis just proximal to the nut of Henry. 4. Subcutaneous edema in the forefoot and ankle is nonspecific, with cellulitis not excluded. Electronically signed by: Ryan Salvage MD 11/06/2024 09:48 AM EST RP Workstation: [id number]   DG Foot  Complete Left Result Date: 11/05/2024 CLINICAL DATA:  Left foot/toe blisters.  History of diabetes. EXAM: LEFT FOOT - COMPLETE 3+ VIEW COMPARISON:  None Available. FINDINGS: Bandaging material overlies the forefoot and toes, which limits detailed evaluation of the underlying soft tissues. No evidence of acute osteolysis or erosive changes to suggest acute osteomyelitis. No acute fracture or dislocation. Mild degenerative changes of  the first MTP joint. Calcaneal enthesopathy at the insertion of the Achilles tendon. Small plantar calcaneal spur. IMPRESSION: 1. No radiographic evidence of acute osteomyelitis at this time. MRI could be obtained for further evaluation if there is persistent clinical concern for osteomyelitis. 2. Bandaging material overlies the forefoot and toes, which limits detailed evaluation of the underlying soft tissues. 3. Calcaneal enthesopathy. Electronically Signed   By: Harrietta Sherry M.D.   On: 11/05/2024 16:12           LOS: 2 days   Time spent= 35 mins    Deliliah Room, MD [facility]  If 7PM-7AM, please contact night-coverage  11/07/2024, 9:24 AM  "

## 2024-11-07 NOTE — Telephone Encounter (Signed)
 Pharmacy Patient Advocate Encounter  Insurance verification completed.    The patient is insured through [facility]. Patient has [employer], may use a copay card, and/or apply for patient assistance if available.    Ran test claim for Lantus  100unit Pen and the current 30 day co-pay is $35.  Ran test claim for Basaglar  100unit Pen and the current 30 day co-pay is $35.  Ran test claim for Tradjenta 5mg  tablet and it is not covered, Plan prefers Zituvio.  This test claim was processed through [facility]- copay amounts may vary at other pharmacies due to pharmacy/plan contracts, or as the patient moves through the different stages of their insurance plan.

## 2024-11-07 NOTE — Inpatient Diabetes Management (Signed)
 Inpatient Diabetes Program Recommendations  AACE/ADA: New Consensus Statement on Inpatient Glycemic Control (2015)  Target Ranges:  Prepandial:   less than 140 mg/dL      Peak postprandial:   less than 180 mg/dL (1-2 hours)      Critically ill patients:  140 - 180 mg/dL   Lab Results  Component Value Date   GLUCAP 195 (H) 11/07/2024   HGBA1C 8.5 (H) 11/05/2024    Review of Glycemic Control  Diabetes history: type 2 Outpatient Diabetes medications: Metformin  500 mg daily (not taking for 3 months) Current orders for Inpatient glycemic control: Lantus  7 units daily, Novolog  0-15 units correction scale TID, Novolog  0-5 units scale at HS, Novolog  5 units TID with meals  Inpatient Diabetes Program Recommendations:   Spoke with patient on the phone since diabetes coordinator is not on campus at [facility] today.   Patient states that he will be seeing a PCP after discharge. He will plan to follow up with the PCP that he saw prior to admission. Patient was on Metformin  500 mg daily, but had not been able to get prescription for about 3 months. Patient needs prescription for blood glucose meter, strips, and lancets kit. Discussed how often to check blood sugars; at least twice per day. Patient is willing to take insulin ; will need to practice giving injection while in the hospital. He states that he has a friend who is a engineer, civil (consulting) and can help him with the insulin  if needed. States that he will talk with PCP about getting a continuous glucose monitor if possible.   Discussed his eating habits. His sister is willing to help him with his diet. Will order Living Well with Diabetes booklet for patient.   For discharge, recommend patient continue with Metformin  500 mg BID (twice a day), Lantus  Solostar insulin  pen 7 units daily. Can follow up with PCP for further medication recommendations.   Marjorie Lunger RN BSN CDE Diabetes Coordinator Pager: [phone]  8am-5pm

## 2024-11-08 LAB — GLUCOSE, CAPILLARY
Glucose-Capillary: 150 mg/dL — ABNORMAL HIGH (ref 70–99)
Glucose-Capillary: 191 mg/dL — ABNORMAL HIGH (ref 70–99)

## 2024-11-08 MED ORDER — INSULIN GLARGINE 100 UNIT/ML ~~LOC~~ SOLN: 7.0000 [IU] | Freq: Every day | SUBCUTANEOUS | 11 refills | Status: AC

## 2024-11-08 MED ORDER — AMOXICILLIN-POT CLAVULANATE 875-125 MG PO TABS: 1.0000 | ORAL_TABLET | Freq: Two times a day (BID) | ORAL | 0 refills | Status: AC

## 2024-11-08 MED ORDER — BLOOD GLUCOSE TEST VI STRP: 1.0000 | ORAL_STRIP | Freq: Three times a day (TID) | 0 refills | Status: AC

## 2024-11-08 MED ORDER — LANCET DEVICE MISC: 1.0000 | Freq: Three times a day (TID) | 0 refills | Status: AC

## 2024-11-08 MED ORDER — METFORMIN HCL 500 MG PO TABS: 500.0000 mg | ORAL_TABLET | Freq: Every day | ORAL | 0 refills | Status: DC

## 2024-11-08 MED ORDER — OXYCODONE HCL 5 MG PO TABS: 5.0000 mg | ORAL_TABLET | ORAL | 0 refills | Status: AC | PRN

## 2024-11-08 MED ORDER — LANCETS MISC: 1.0000 | 0 refills | Status: AC

## 2024-11-08 MED ORDER — BLOOD GLUCOSE MONITORING SUPPL DEVI: 1.0000 | Freq: Three times a day (TID) | 0 refills | Status: AC

## 2024-11-08 MED ORDER — ATORVASTATIN CALCIUM 40 MG PO TABS: 40.0000 mg | ORAL_TABLET | Freq: Every day | ORAL | 0 refills | Status: DC

## 2024-11-08 NOTE — Progress Notes (Signed)
 Instructions and demonstration given for wound care to the left foot. Patient verbalized understanding.

## 2024-11-08 NOTE — Discharge Summary (Signed)
 " Physician Discharge Summary   Patient: Calvin Wells MRN: [id number] DOB: 11/16/1970  Admit date:     11/05/2024  Discharge date: 11/08/2024  Discharge Physician: Reyes VEAR Gaw   PCP: Patient, No Pcp Per   Recommendations at discharge:    Pt discharged home with wound care instructions via nursing.  Discharged home on 7 units of lantus  nightly until seeing PCP on Tuesday.  Also DC'ed with new script for glucometer and insulin  supplies.  Pt interested in CBG at PCP follow-up.   DC'ed with 5 days worth of oxycodone  for pain medicine.  Also atorvastatin  as HLD medication.  Follow-up to ensure that blood cultures remain negative.      Discharge Diagnoses: Principal Problem:   Left foot infection  Resolved Problems:   * No resolved hospital problems. *  Hospital Course: 54 y.o. male with medical history significant for hypertension, type 2 diabetes, diabetic polyneuropathy, hyperlipidemia, who presents to the ER due to left foot infection.  He initially presented at his PCPs office for the same and was referred to the ER for further evaluation.  Endorses blisters to all toes on his left foot which he noted on Saturday, 4 days ago.    MRI ordered and ruled out OM.  Started on IV antibiotics   F/u blood cultures-->no growth x 48 hours at discharge.  Continued to improve while in house.  Nursing care provided wound care as well as wound care instructions at discharge.   Due to degree of his improvement at time of discharge and fact all redness/cellulitis symptoms had resolved, he was able to DC'ed on 11/08/2024.      Lives at home. Discharged with new prescriptions for all the medications including glucometer kit.     Assessment & Plan:  Principal Problem:   Left foot infection   Left diabetic foot cellulitis, osteomyelitis ruled out, POA Follow up peripheral blood cultures x 2-no growth to date. Switched to Augmentin  at discharge.   MRI negative for osteomyelitis.   Cellulitis and foot  edema had completely resolved at time of discharge.     Type 2 diabetes with hyperglycemia as well as diabetic neuropathy and diabetic left foot ulceration/cellulitis,POA: Follow hemoglobin A1c-8.5% Insulin  coverage with long-acting (ordered insulin  Lantus  7 units at bedtime daily) and sliding scale insulin  Heart healthy carb modified diet. Needs prescription for insulin  as well as glucometer kit on discharge-->provided.   Consulted diabetes coordinator   Diabetic polyneuropathy Started on gabapentin  100 mg PO TID. Not on any meds at home   Hypertension Not on any meds at home PRN IV Hydralazine  10 mg Q6H for SBP > 160 mmHg   Hyperlipidemia On no meds at home. Started on Lipitor 40 mg p.o. daily.  Will need a prescription for that on discharge Repeat lipid panel in 2 to 3 months       Disposition: Home Diet recommendation:  Carb modified diet DISCHARGE MEDICATION: Allergies as of 11/08/2024   No Known Allergies      Medication List     STOP taking these medications    gemfibrozil  600 MG tablet Commonly known as: LOPID    oseltamivir 75 MG capsule Commonly known as: TAMIFLU       TAKE these medications    amoxicillin -clavulanate 875-125 MG tablet Commonly known as: AUGMENTIN  Take 1 tablet by mouth 2 (two) times daily for 10 days.   atorvastatin  40 MG tablet Commonly known as: LIPITOR Take 1 tablet (40 mg total) by mouth at bedtime.  Blood Glucose Monitoring Suppl Devi 1 each by Does not apply route in the morning, at noon, and at bedtime. May substitute to any manufacturer covered by patient's insurance. ICD E11.52   BLOOD GLUCOSE TEST STRIPS Strp 1 each by In Vitro route in the morning, at noon, and at bedtime. May substitute to any manufacturer covered by patient's insurance.   fenofibrate 48 MG tablet Commonly known as: TRICOR Take 48 mg by mouth daily.   insulin  glargine 100 UNIT/ML injection Commonly known as: LANTUS  Inject 0.07 mLs (7 Units  total) into the skin at bedtime.   Lancet Device Misc 1 each by Does not apply route in the morning, at noon, and at bedtime. May substitute to any manufacturer covered by patient's insurance.   Lancets Misc 1 each by Does not apply route as directed. Dispense based on patient and insurance preference. Use up to four times daily as directed. (FOR ICD-10 E10.9, E11.9).   lisinopril 10 MG tablet Commonly known as: ZESTRIL Take 10 mg by mouth daily.   metFORMIN  500 MG tablet Commonly known as: GLUCOPHAGE  Take 1 tablet (500 mg total) by mouth daily with breakfast. NO MORE REFILLS WITHOUT OFFICE VISIT - 2ND NOTICE   oxyCODONE  5 MG immediate release tablet Commonly known as: Oxy IR/ROXICODONE  Take 1-2 tablets (5-10 mg total) by mouth every 4 (four) hours as needed for up to 5 days for moderate pain (pain score 4-6) or breakthrough pain.        Discharge Exam: Filed Weights   11/05/24 1515  Weight: 89.4 kg   Gen:  Alert, cooperative patient who appears stated age in no acute distress.  Vital signs reviewed.. Heart:  RRR Lungs:  CTAB Abd:  S/ND/NT Ext:  No LE edema Skin:  Wound Left foot still with some maceration 4 toes.  No surrounding redness/cellulitis and no further foot edema noted.    Condition at discharge: good  The results of significant diagnostics from this hospitalization (including imaging, microbiology, ancillary and laboratory) are listed below for reference.   Imaging Studies: MR FOOT LEFT W WO CONTRAST Result Date: 11/06/2024 EXAM: MRI of the left Foot with and without contrast. 11/06/2024 08:59:05 AM TECHNIQUE: Multiplanar multisequence MRI of the left foot was performed with and without the administration of intravenous contrast. COMPARISON: 11/05/2024 CLINICAL HISTORY: Wound infection. FINDINGS: LISFRANC JOINT: Visualized Lisfranc ligament is intact. No significant Lisfranc interval widening or significant periligamentous edema. BONE MARROW: No acute fracture or  aggressive marrow replacing lesion. No abnormal marrow edema or significant abnormal marrow enhancement in the midfoot or forefoot to indicate osteomyelitis. GREATER AND LESSER MTP JOINTS: No significant joint effusion or osseous erosions. No significant degenerative changes. Normal alignment. SOFT TISSUES: Dorsal subcutaneous edema in the forefoot is nonspecific, cellulitis not excluded. Medial and lateral subcutaneous edema in the ankle region. The patient has known blistering/wounds along the distal and plantar great toe, medial second toe, and along the third toe as well as along the medial ball of the foot, only faintly appreciable on MRI. Mildly accentuated cutaneous and subcutaneous enhancement in the great toe and second toe and along the nail bed of the third toe, likely inflammatory, although cellulitis is not excluded. No drainable abscess. TENDONS: Flexor hallucis longus tenosynovitis just proximal to the nut of Henry. Visualized extensor tendons are intact without tenosynovitis. IMPRESSION: 1. Cutaneous and subcutaneous enhancement involving the great toe, second toe, and the nail bed of the third toe, likely inflammatory, with cellulitis not excluded, and no drainable abscess. 2.  No osteomyelitis in the midfoot or forefoot. 3. Flexor hallucis longus tenosynovitis just proximal to the nut of Henry. 4. Subcutaneous edema in the forefoot and ankle is nonspecific, with cellulitis not excluded. Electronically signed by: Ryan Salvage MD 11/06/2024 09:48 AM EST RP Workstation: [id number]   DG Foot Complete Left Result Date: 11/05/2024 CLINICAL DATA:  Left foot/toe blisters.  History of diabetes. EXAM: LEFT FOOT - COMPLETE 3+ VIEW COMPARISON:  None Available. FINDINGS: Bandaging material overlies the forefoot and toes, which limits detailed evaluation of the underlying soft tissues. No evidence of acute osteolysis or erosive changes to suggest acute osteomyelitis. No acute fracture or dislocation. Mild  degenerative changes of the first MTP joint. Calcaneal enthesopathy at the insertion of the Achilles tendon. Small plantar calcaneal spur. IMPRESSION: 1. No radiographic evidence of acute osteomyelitis at this time. MRI could be obtained for further evaluation if there is persistent clinical concern for osteomyelitis. 2. Bandaging material overlies the forefoot and toes, which limits detailed evaluation of the underlying soft tissues. 3. Calcaneal enthesopathy. Electronically Signed   By: Harrietta Sherry M.D.   On: 11/05/2024 16:12    Microbiology: Results for orders placed or performed during the hospital encounter of 11/05/24  Culture, blood (routine x 2)     Status: None (Preliminary result)   Collection Time: 11/05/24  5:21 PM   Specimen: BLOOD  Result Value Ref Range Status   Specimen Description BLOOD RIGHT ANTECUBITAL  Final   Special Requests   Final    AEROBIC BOTTLE ONLY Blood Culture results may not be optimal due to an inadequate volume of blood received in culture bottles   Culture   Final    NO GROWTH 3 DAYS Performed at [facility], [address], [place], [place] [address]    Report Status PENDING  Incomplete  Culture, blood (routine x 2)     Status: None (Preliminary result)   Collection Time: 11/05/24  9:13 PM   Specimen: BLOOD  Result Value Ref Range Status   Specimen Description BLOOD BLOOD RIGHT ARM  Final   Special Requests   Final    BOTTLES DRAWN AEROBIC AND ANAEROBIC Blood Culture adequate volume   Culture   Final    NO GROWTH 3 DAYS Performed at [facility], [address], [place], [place] [address]    Report Status PENDING  Incomplete    Labs: CBC: Recent Labs  Lab 11/05/24 1721 11/06/24 0428 11/07/24 0446  WBC 13.4* 9.5 11.3*  NEUTROABS 9.2*  --  7.1  HGB 15.5 13.3 13.8  HCT 43.8 37.8* 39.8  MCV 91.6 92.4 93.9  PLT 274 262 276   Basic Metabolic Panel: Recent Labs  Lab 11/05/24 1721 11/05/24 2113 11/06/24 0428 11/07/24 0446  NA 132*   --  134* 134*  K 3.6  --  3.5 4.2  CL 94*  --  96* 98  CO2 23  --  31 29  GLUCOSE 221*  --  183* 173*  BUN 11  --  11 12  CREATININE 0.82  --  0.82 0.97  CALCIUM  9.0  --  8.6* 8.7*  MG  --  2.4  --   --   PHOS  --  3.0  --   --    Liver Function Tests: No results for input(s): AST, ALT, ALKPHOS, BILITOT, PROT, ALBUMIN in the last 168 hours. CBG: Recent Labs  Lab 11/07/24 1215 11/07/24 1621 11/07/24 2107 11/08/24 0743 11/08/24 1115  GLUCAP 195* 122* 125* 150* 191*  Discharge time spent: less than 30 minutes.  Signed: Reyes VEAR Gaw, MD [facility] 11/08/2024 "

## 2024-11-08 NOTE — Progress Notes (Signed)
Discharge instructions given to patient who verbalized understanding. 

## 2024-11-10 LAB — CULTURE, BLOOD (ROUTINE X 2)
Culture: NO GROWTH
Culture: NO GROWTH
Special Requests: ADEQUATE

## 2024-11-12 ENCOUNTER — Ambulatory Visit: Admitting: Family Medicine

## 2024-11-12 ENCOUNTER — Encounter: Payer: Self-pay | Admitting: Family Medicine

## 2024-11-12 VITALS — BP 115/75 | HR 93 | Temp 98.1°F | Ht 71.0 in | Wt 199.0 lb

## 2024-11-12 DIAGNOSIS — S91302D Unspecified open wound, left foot, subsequent encounter: Secondary | ICD-10-CM

## 2024-11-12 DIAGNOSIS — Z7984 Long term (current) use of oral hypoglycemic drugs: Secondary | ICD-10-CM | POA: Diagnosis not present

## 2024-11-12 DIAGNOSIS — E782 Mixed hyperlipidemia: Secondary | ICD-10-CM | POA: Diagnosis not present

## 2024-11-12 DIAGNOSIS — S91302A Unspecified open wound, left foot, initial encounter: Secondary | ICD-10-CM

## 2024-11-12 DIAGNOSIS — E1140 Type 2 diabetes mellitus with diabetic neuropathy, unspecified: Secondary | ICD-10-CM | POA: Diagnosis not present

## 2024-11-12 MED ORDER — GABAPENTIN 100 MG PO CAPS: 100.0000 mg | ORAL_CAPSULE | Freq: Two times a day (BID) | ORAL | 1 refills | Status: AC

## 2024-11-12 MED ORDER — AMOXICILLIN-POT CLAVULANATE 875-125 MG PO TABS: 1.0000 | ORAL_TABLET | Freq: Two times a day (BID) | ORAL | 0 refills | Status: AC

## 2024-11-12 MED ORDER — TIRZEPATIDE 2.5 MG/0.5ML ~~LOC~~ SOAJ: 2.5000 mg | SUBCUTANEOUS | 0 refills | Status: AC

## 2024-11-12 NOTE — Progress Notes (Signed)
 "  New Patient Office Visit  Patient ID: Calvin Wells, Male   DOB: Jun 28, 1971 54 y.o. MRN: [id number]  Chief Complaint  Patient presents with   Hospitalization Follow-up    Patient was seen at [facility] ER on 11/05/2024, admitted on 11/06/2024, and discharged on 11/08/2024, for LT foot infection.   Subjective:     Calvin Wells presents to establish care  HPI  Discussed the use of AI scribe software for clinical note transcription with the patient, who gave verbal consent to proceed.  History of Present Illness   Calvin Wells is a 54 year old male who is here to establish care.  Accompanied by sister.   Patient reports that he has not had a PCP in the last several months.  Hx of DM2.   Left foot ulcer and infection - Developed left foot ulcer on November 02, 2024 - Presented to the emergency department on November 05, 2024. Discharged on 11/08/24.  - MRI of the left foot showed no osteomyelitis - Blood cultures were negative - Received IV antibiotics in the emergency department - Cellulitis of the foot resolved and the patient was discharged with Augmentin . - Wound care instructions provided by the hospital. - Patient reports that he has been cleaning his foot with hypochlorous acid as recommended by the hospital and wrapping with Xeroform. - Patient states that the wound does look better. - Discharged with oxycodone  x 5 days.   Glycemic control and diabetes management - Type 2 diabetes mellitus - Previously not taking metformin  for several months due to not having PCP.  - A1C at hospital was 8.5.  - Restarted metformin  500 mg once daily by the hospital.  [facility] ordered insulin  but the patient has not received it yet. - Patient reports glucose has been around 150 since restarting metformin  on home check.   Peripheral neuropathy - Numbness and occasional stinging sensations in the feet [facility] notes state that they started the patient on gabapentin  but he reports that he has not  received it yet.  Hypertension management - Previously managed with antihypertensive medication, currently not taking medication - Home blood pressure readings have been within normal limits  Hyperlipidemia management - Patient started on atorvastatin  and fenofibrate by the hospital.       Outpatient Encounter Medications as of 11/12/2024  Medication Sig   ACCU-CHEK GUIDE TEST test strip 3 (three) times daily.   amoxicillin -clavulanate (AUGMENTIN ) 875-125 MG tablet Take 1 tablet by mouth 2 (two) times daily for 10 days.   amoxicillin -clavulanate (AUGMENTIN ) 875-125 MG tablet Take 1 tablet by mouth 2 (two) times daily for 7 days.   atorvastatin  (LIPITOR) 40 MG tablet Take 1 tablet (40 mg total) by mouth at bedtime.   Blood Glucose Monitoring Suppl DEVI 1 each by Does not apply route in the morning, at noon, and at bedtime. May substitute to any manufacturer covered by patient's insurance. ICD E11.52   fenofibrate (TRICOR) 48 MG tablet Take 48 mg by mouth daily.   gabapentin  (NEURONTIN ) 100 MG capsule Take 1 capsule (100 mg total) by mouth 2 (two) times daily.   Glucose Blood (BLOOD GLUCOSE TEST STRIPS) STRP 1 each by In Vitro route in the morning, at noon, and at bedtime. May substitute to any manufacturer covered by patient's insurance.   insulin  glargine (LANTUS ) 100 UNIT/ML injection Inject 0.07 mLs (7 Units total) into the skin at bedtime.   Lancet Device MISC 1 each by Does not apply route in the morning, at  noon, and at bedtime. May substitute to any manufacturer covered by patient's insurance.   Lancets MISC 1 each by Does not apply route as directed. Dispense based on patient and insurance preference. Use up to four times daily as directed. (FOR ICD-10 E10.9, E11.9).   metFORMIN  (GLUCOPHAGE ) 500 MG tablet Take 1 tablet (500 mg total) by mouth daily with breakfast. NO MORE REFILLS WITHOUT OFFICE VISIT - 2ND NOTICE   oxyCODONE  (OXY IR/ROXICODONE ) 5 MG immediate release tablet Take 1-2  tablets (5-10 mg total) by mouth every 4 (four) hours as needed for up to 5 days for moderate pain (pain score 4-6) or breakthrough pain.   tirzepatide  (MOUNJARO ) 2.5 MG/0.5ML Pen Inject 2.5 mg into the skin once a week for 4 doses.   [DISCONTINUED] insulin  glargine-yfgn (SEMGLEE ) 100 UNIT/ML injection    lisinopril (ZESTRIL) 10 MG tablet Take 10 mg by mouth daily. (Patient not taking: Reported on 11/12/2024)   No facility-administered encounter medications on file as of 11/12/2024.    Past Medical History:  Diagnosis Date   Diabetes mellitus without complication (HCC)    Hypertension    Neuropathy     Past Surgical History:  Procedure Laterality Date   STOMACH FOREIGN BODY REMOVAL     swallowed a dart as a child    Family History  Problem Relation Age of Onset   Diabetes Mother    Heart disease Father    Diabetes Sister     Social History   Socioeconomic History   Marital status: Single    Spouse name: Not on file   Number of children: Not on file   Years of education: Not on file   Highest education level: Not on file  Occupational History   Not on file  Tobacco Use   Smoking status: Former    Current packs/day: 1.00    Average packs/day: 1 pack/day for 24.0 years (24.0 ttl pk-yrs)    Types: Cigarettes    Passive exposure: Past   Smokeless tobacco: Never  Vaping Use   Vaping status: Never Used  Substance and Sexual Activity   Alcohol use: No   Drug use: No   Sexual activity: Never    Birth control/protection: Abstinence  Other Topics Concern   Not on file  Social History Narrative   Not on file   Social Drivers of Health   Tobacco Use: Medium Risk (11/12/2024)   Patient History    Smoking Tobacco Use: Former    Smokeless Tobacco Use: Never    Passive Exposure: Past  Physicist, Medical Strain: Not on file  Food Insecurity: No Food Insecurity (11/06/2024)   Epic    Worried About Programme Researcher, Broadcasting/film/video in the Last Year: Never true    Ran Out of Food in the  Last Year: Never true  Transportation Needs: No Transportation Needs (11/06/2024)   Epic    Lack of Transportation (Medical): No    Lack of Transportation (Non-Medical): No  Physical Activity: Not on file  Stress: Not on file  Social Connections: Not on file  Intimate Partner Violence: Not At Risk (11/06/2024)   Epic    Fear of Current or Ex-Partner: No    Emotionally Abused: No    Physically Abused: No    Sexually Abused: No  Depression (PHQ2-9): Low Risk (11/12/2024)   Depression (PHQ2-9)    PHQ-2 Score: 1  Alcohol Screen: Not on file  Housing: Low Risk (11/06/2024)   Epic    Unable to Pay for Housing  in the Last Year: No    Number of Times Moved in the Last Year: 0    Homeless in the Last Year: No  Utilities: Not At Risk (11/06/2024)   Epic    Threatened with loss of utilities: No  Health Literacy: Not on file    ROS    Objective:    BP 115/75 (Cuff Size: Large)   Pulse 93   Temp 98.1 F (36.7 C)   Ht 5' 11 (1.803 m)   Wt 199 lb (90.3 kg)   SpO2 97%   BMI 27.75 kg/m   Physical Exam Vitals reviewed.  Constitutional:      Appearance: Normal appearance.  HENT:     Head: Normocephalic and atraumatic.  Eyes:     Extraocular Movements: Extraocular movements intact.     Conjunctiva/sclera: Conjunctivae normal.     Pupils: Pupils are equal, round, and reactive to light.  Cardiovascular:     Rate and Rhythm: Normal rate and regular rhythm.     Pulses: Normal pulses.     Heart sounds: Normal heart sounds. No murmur heard. Pulmonary:     Effort: Pulmonary effort is normal. No respiratory distress.     Breath sounds: Normal breath sounds.  Musculoskeletal:        General: Normal range of motion.     Cervical back: Normal range of motion.  Skin:    General: Skin is warm and dry.     Comments: See photo of L foot wound attached  Neurological:     General: No focal deficit present.     Mental Status: He is alert and oriented to person, place, and time.  Psychiatric:         Mood and Affect: Mood normal.        Behavior: Behavior normal.          Assessment & Plan:   Problem List Items Addressed This Visit       Endocrine   Diabetes mellitus, type 2 (HCC) - Primary   Relevant Medications   gabapentin  (NEURONTIN ) 100 MG capsule   amoxicillin -clavulanate (AUGMENTIN ) 875-125 MG tablet   tirzepatide  (MOUNJARO ) 2.5 MG/0.5ML Pen   Other Relevant Orders   Comprehensive metabolic panel with GFR     Other   Hyperlipidemia   Other Visit Diagnoses       Wound of left foot         Elevated triglycerides with high cholesterol           Assessment and Plan    Diabetic foot ulcer with cellulitis, left foot - Cellulitis resolved. Wound healing.   - Patient performing wound care per hospital instructions - Patient prescribed 10-day Augmentin  prescription by hospital.  I have sent an additional 7 days of Augmentin  to take after the 10 days is completed. - Use hypochlorous acid for cleaning and Vaseline gauze for dressing. - Wound re-bandaged in office today.  - Follow-up in one week to reassess wound healing or sooner if symptoms worsen.   Type 2 diabetes mellitus with diabetic neuropathy - Increase metformin  to 500 mg twice daily. - Initiate mounjaro  2.5 mg/day x 4 weeks.  Discussed potential side effects of Mounjaro . - Do not start insulin .  - Start gabapentin  100 mg twice daily for neuropathic pain. Do not take with oxycodone . Discussed potential side effects of gabapentin .  - Monitor blood glucose levels regularly. - No history MTC.   Hyperlipidemia and hypertriglyceridemia - Recently started on atorvastatin  and fenofibrate.  Hypertension -  Blood pressure well-controlled at 115/75 mmHg, not on medication. - Do no restart BP medication at this time.  - Monitor blood pressure at home regularly. - Report BP that is consistently above 130/80.        Return in about 1 week (around 11/19/2024) for wound recheck .   Oneil LELON Severin,  FNP [facility]     "

## 2024-11-13 LAB — COMPREHENSIVE METABOLIC PANEL WITH GFR
ALT: 16 IU/L (ref 0–44)
AST: 21 IU/L (ref 0–40)
Albumin: 4.5 g/dL (ref 3.8–4.9)
Alkaline Phosphatase: 79 IU/L (ref 47–123)
BUN/Creatinine Ratio: 14 (ref 9–20)
BUN: 14 mg/dL (ref 6–24)
Bilirubin Total: 0.5 mg/dL (ref 0.0–1.2)
CO2: 21 mmol/L (ref 20–29)
Calcium: 10 mg/dL (ref 8.7–10.2)
Chloride: 99 mmol/L (ref 96–106)
Creatinine, Ser: 0.97 mg/dL (ref 0.76–1.27)
Globulin, Total: 3 g/dL (ref 1.5–4.5)
Glucose: 161 mg/dL — ABNORMAL HIGH (ref 70–99)
Potassium: 4.6 mmol/L (ref 3.5–5.2)
Sodium: 138 mmol/L (ref 134–144)
Total Protein: 7.5 g/dL (ref 6.0–8.5)
eGFR: 92 mL/min/1.73

## 2024-11-19 ENCOUNTER — Encounter: Payer: Self-pay | Admitting: Family Medicine

## 2024-11-19 ENCOUNTER — Ambulatory Visit: Admitting: Family Medicine

## 2024-11-19 VITALS — BP 126/81 | HR 89 | Temp 98.3°F | Ht 71.0 in | Wt 199.0 lb

## 2024-11-19 DIAGNOSIS — Z7984 Long term (current) use of oral hypoglycemic drugs: Secondary | ICD-10-CM | POA: Diagnosis not present

## 2024-11-19 DIAGNOSIS — E1169 Type 2 diabetes mellitus with other specified complication: Secondary | ICD-10-CM | POA: Diagnosis not present

## 2024-11-19 DIAGNOSIS — E1165 Type 2 diabetes mellitus with hyperglycemia: Secondary | ICD-10-CM | POA: Insufficient documentation

## 2024-11-19 DIAGNOSIS — S91302A Unspecified open wound, left foot, initial encounter: Secondary | ICD-10-CM | POA: Diagnosis not present

## 2024-11-19 DIAGNOSIS — E782 Mixed hyperlipidemia: Secondary | ICD-10-CM

## 2024-11-19 DIAGNOSIS — I10 Essential (primary) hypertension: Secondary | ICD-10-CM | POA: Insufficient documentation

## 2024-11-19 DIAGNOSIS — E785 Hyperlipidemia, unspecified: Secondary | ICD-10-CM | POA: Diagnosis not present

## 2024-11-19 MED ORDER — FENOFIBRATE 48 MG PO TABS: 48.0000 mg | ORAL_TABLET | Freq: Every day | ORAL | 1 refills | Status: AC

## 2024-11-19 NOTE — Progress Notes (Signed)
 "  Acute Office Visit  Patient ID: Calvin Wells, male    DOB: 10/02/1971, 54 y.o.   MRN: [id number]  PCP: Alcus Calvin ORN, FNP  Chief Complaint  Patient presents with   Wound Check    1 week wound check    Subjective:     Wound Check    Discussed the use of AI scribe software for clinical note transcription with the patient, who gave verbal consent to proceed.  History of Present Illness   Calvin Wells is a 55 year old male who presents for follow-up on foot infection and medication management.  Foot infection - Currently taking Augmentin  for treatment - Keeps the foot wrapped and clean - Wound seems to be improving.  Diabetes mellitus - Taking metformin  for glycemic control - Has not started a new diabetes medication yet.       ROS     Objective:    BP 126/81 (Cuff Size: Large)   Pulse 89   Temp 98.3 F (36.8 C)   Ht 5' 11 (1.803 m)   Wt 199 lb (90.3 kg)   SpO2 98%   BMI 27.75 kg/m    Physical Exam Vitals reviewed.  Constitutional:      Appearance: Normal appearance.  HENT:     Head: Normocephalic and atraumatic.  Eyes:     Extraocular Movements: Extraocular movements intact.     Conjunctiva/sclera: Conjunctivae normal.     Pupils: Pupils are equal, round, and reactive to light.  Cardiovascular:     Rate and Rhythm: Normal rate and regular rhythm.     Pulses: Normal pulses.     Heart sounds: Normal heart sounds. No murmur heard. Pulmonary:     Effort: Pulmonary effort is normal. No respiratory distress.     Breath sounds: Normal breath sounds.  Musculoskeletal:        General: Normal range of motion.     Cervical back: Normal range of motion.  Skin:    General: Skin is warm and dry.     Comments: See photo of L foot wound attached  Neurological:     General: No focal deficit present.     Mental Status: He is alert and oriented to person, place, and time.  Psychiatric:        Mood and Affect: Mood normal.        Behavior: Behavior normal.        No results found for any visits on 11/19/24.     Assessment & Plan:   Problem List Items Addressed This Visit       Other   Mixed hyperlipidemia   Relevant Medications   fenofibrate  (TRICOR ) 48 MG tablet   Other Visit Diagnoses       Wound of left foot    -  Primary     Hyperlipidemia associated with type 2 diabetes mellitus (HCC)       Relevant Medications   fenofibrate  (TRICOR ) 48 MG tablet   Other Relevant Orders   [facility]B Referral to [facility]     Elevated triglycerides with high cholesterol       Relevant Medications   fenofibrate  (TRICOR ) 48 MG tablet       Assessment and Plan    Cellulitis of the right foot - Appears to be healing well - Continue Augmentin . - Rewrapped foot in office today. - Continue wound care at home - Re-evaluate in two weeks.  Type 2 diabetes mellitus with hyperlipidemia - Continue metformin . -  Patient has not picked up Mounjaro  from the pharmacy yet.  Discussed calling the pharmacy and obtaining this medicine - Continue fenofibrate  and Lipitor. - Referred to lipid clinic. - Patient unable to produce urine today for urine albumin/creatinine ratio.  Will plan to obtain at the next visit.       Meds ordered this encounter  Medications   fenofibrate  (TRICOR ) 48 MG tablet    Sig: Take 1 tablet (48 mg total) by mouth daily.    Dispense:  90 tablet    Refill:  1    Supervising Provider:   JOLINDA NORENE HERO [[id number]]    No follow-ups on file.  Calvin LELON Severin, FNP [facility]   "

## 2024-11-20 ENCOUNTER — Telehealth: Payer: Self-pay | Admitting: Family Medicine

## 2024-11-20 DIAGNOSIS — Z0279 Encounter for issue of other medical certificate: Secondary | ICD-10-CM

## 2024-11-20 NOTE — Telephone Encounter (Signed)
 PT dropped off Disability forms to be completed and signed.  Form Fee Paid? (Yes)            If NO, form is placed on front office manager desk to hold until payment received. If YES, then form will be placed in the RX/HH Nurse Coordinators box for completion.  Form will not be processed until payment is received

## 2024-11-21 ENCOUNTER — Telehealth: Payer: Self-pay | Admitting: Family Medicine

## 2024-11-21 NOTE — Telephone Encounter (Signed)
 Called and spoke with [facility] and they confirmed that they do have the Rx and will get it ready for patient now. I called and spoke with patient and made him aware.    Copied from CRM #[id number]. Topic: Clinical - Prescription Issue >> Nov 21, 2024  2:12 PM Marda MATSU wrote: Patient says pharmacy has not received rx for: fenofibrate  (TRICOR ) 48 MG tablet   although in rx chart notes it says :   E-Prescribing Status: Receipt confirmed by pharmacy (11/19/2024 11:45 AM EST)

## 2024-11-21 NOTE — Telephone Encounter (Signed)
 Spoke with patient and he says insurance is requiring Prior Authorization on his Mounjaro  Rx. Will send to our Prior Authorization team.

## 2024-11-22 ENCOUNTER — Other Ambulatory Visit (HOSPITAL_COMMUNITY): Payer: Self-pay

## 2024-11-22 ENCOUNTER — Telehealth: Payer: Self-pay

## 2024-11-22 NOTE — Telephone Encounter (Signed)
 Pharmacy Patient Advocate Encounter  Received notification from [facility] that Prior Authorization for Mounjaro  2.5MG /0.5ML auto-injectors has been APPROVED from 11/22/24 to 11/22/25. Ran test claim, Copay is $25. This test claim was processed through [facility]- copay amounts may vary at other pharmacies due to pharmacy/plan contracts, or as the patient moves through the different stages of their insurance plan.   PA #/Case ID/Reference #: [id number]

## 2024-11-22 NOTE — Telephone Encounter (Signed)
 Pharmacy Patient Advocate Encounter   Received notification from Pt Calls Messages that prior authorization for Mounjaro  2.5MG /0.5ML auto-injectors is required/requested.   Insurance verification completed.   The patient is insured through [facility].   Per test claim: PA required; PA submitted to above mentioned insurance via Latent Key/confirmation #/EOC [id number] Status is pending

## 2024-11-22 NOTE — Telephone Encounter (Signed)
 PA request has been Submitted. New Encounter has been or will be created for follow up. For additional info see Pharmacy Prior Auth telephone encounter from 11/22/24.

## 2024-11-26 ENCOUNTER — Other Ambulatory Visit: Payer: Self-pay

## 2024-11-26 ENCOUNTER — Other Ambulatory Visit: Payer: Self-pay | Admitting: Family Medicine

## 2024-11-26 ENCOUNTER — Telehealth: Payer: Self-pay | Admitting: Family Medicine

## 2024-11-26 DIAGNOSIS — E1165 Type 2 diabetes mellitus with hyperglycemia: Secondary | ICD-10-CM

## 2024-11-26 MED ORDER — ATORVASTATIN CALCIUM 40 MG PO TABS: 40.0000 mg | ORAL_TABLET | Freq: Every day | ORAL | 0 refills | Status: AC

## 2024-11-26 MED ORDER — METFORMIN HCL 500 MG PO TABS: 500.0000 mg | ORAL_TABLET | Freq: Two times a day (BID) | ORAL | 2 refills | Status: AC

## 2024-11-26 NOTE — Telephone Encounter (Signed)
 Refilled in another encounter

## 2024-11-26 NOTE — Telephone Encounter (Signed)
 Copied from CRM #[id number]. Topic: Clinical - Medication Refill >> Nov 26, 2024 12:06 PM Wess RAMAN wrote: Medication: metFORMIN  (GLUCOPHAGE ) 500 MG tablet  atorvastatin  (LIPITOR) 40 MG tablet   Has the patient contacted their pharmacy? Yes (Agent: If no, request that the patient contact the pharmacy for the refill. If patient does not wish to contact the pharmacy document the reason why and proceed with request.) (Agent: If yes, when and what did the pharmacy advise?)  This is the patient's preferred pharmacy:   [id number] [place], [place] - [address] [place] [address] Phone: [phone] Fax: [phone]  Is this the correct pharmacy for this prescription? Yes If no, delete pharmacy and type the correct one.   Has the prescription been filled recently? Yes  Is the patient out of the medication? Yes  Has the patient been seen for an appointment in the last year OR does the patient have an upcoming appointment? Yes  Can we respond through MyChart? No  Agent: Please be advised that Rx refills may take up to 3 business days. We ask that you follow-up with your pharmacy.

## 2024-11-26 NOTE — Telephone Encounter (Signed)
 Copied from CRM #[id number]. Topic: Clinical - Medication Question >> Nov 26, 2024 12:10 PM Wess RAMAN wrote: Reason for CRM: Patient would like to know if he should continue taking Amoxicillin  125 MG or if he needs a new prescription sent  Callback #: [id number]  Pharmacy: [id number] - [place], [place] - [address] [place] [address] Phone: [phone] Fax: [phone] Hours: Not open 24 hours

## 2024-11-27 NOTE — Telephone Encounter (Signed)
 Pt aware of provider feedback and states he never picked up the 7 day rx for Augmentin  so he will get it filled and take.

## 2024-11-29 NOTE — Telephone Encounter (Signed)
 Aware faxed, his paperwork is at the front desk ready for pickup

## 2024-12-03 ENCOUNTER — Encounter: Payer: Self-pay | Admitting: Family Medicine

## 2024-12-03 ENCOUNTER — Ambulatory Visit: Admitting: Family Medicine

## 2024-12-03 VITALS — BP 130/78 | HR 80 | Temp 98.5°F | Ht 71.0 in | Wt 202.0 lb

## 2024-12-03 DIAGNOSIS — Z7985 Long-term (current) use of injectable non-insulin antidiabetic drugs: Secondary | ICD-10-CM | POA: Diagnosis not present

## 2024-12-03 DIAGNOSIS — E1169 Type 2 diabetes mellitus with other specified complication: Secondary | ICD-10-CM | POA: Diagnosis not present

## 2024-12-03 DIAGNOSIS — Z7984 Long term (current) use of oral hypoglycemic drugs: Secondary | ICD-10-CM

## 2024-12-03 DIAGNOSIS — S91302A Unspecified open wound, left foot, initial encounter: Secondary | ICD-10-CM

## 2024-12-03 DIAGNOSIS — E1165 Type 2 diabetes mellitus with hyperglycemia: Secondary | ICD-10-CM

## 2024-12-03 DIAGNOSIS — S91302D Unspecified open wound, left foot, subsequent encounter: Secondary | ICD-10-CM

## 2024-12-03 DIAGNOSIS — E785 Hyperlipidemia, unspecified: Secondary | ICD-10-CM

## 2024-12-18 ENCOUNTER — Ambulatory Visit: Admitting: Family Medicine

## 2024-12-25 ENCOUNTER — Institutional Professional Consult (permissible substitution) (HOSPITAL_BASED_OUTPATIENT_CLINIC_OR_DEPARTMENT_OTHER): Admitting: Internal Medicine
